# Patient Record
Sex: Male | Born: 1955 | Race: Black or African American | Hispanic: No | Marital: Single | Smoking: Smoker, current status unknown
Health system: Southern US, Community
[De-identification: ages and names within clinical notes are randomized; demographics above are authoritative.]

## PROBLEM LIST (undated history)

## (undated) ENCOUNTER — Encounter

## (undated) ENCOUNTER — Telehealth

## (undated) DIAGNOSIS — F1910 Other psychoactive substance abuse, uncomplicated: Secondary | ICD-10-CM

## (undated) DIAGNOSIS — G709 Myoneural disorder, unspecified: Secondary | ICD-10-CM

## (undated) DIAGNOSIS — E119 Type 2 diabetes mellitus without complications: Secondary | ICD-10-CM

## (undated) DIAGNOSIS — I10 Essential (primary) hypertension: Secondary | ICD-10-CM

## (undated) HISTORY — PX: NO PAST SURGERIES: SHX2092

---

## 2016-10-18 ENCOUNTER — Inpatient Hospital Stay (HOSPITAL_COMMUNITY)
Admission: EM | Admit: 2016-10-18 | Discharge: 2016-10-27 | DRG: 092 | Disposition: A | Discharge status: Skilled Nursing Facility | Payer: Medicare Other | Attending: Internal Medicine | Admitting: Internal Medicine

## 2016-10-18 ENCOUNTER — Emergency Department (HOSPITAL_COMMUNITY): Payer: Medicare Other

## 2016-10-18 ENCOUNTER — Encounter (HOSPITAL_COMMUNITY): Payer: Self-pay | Admitting: Internal Medicine

## 2016-10-18 DIAGNOSIS — T796XXA Traumatic ischemia of muscle, initial encounter: Secondary | ICD-10-CM

## 2016-10-18 DIAGNOSIS — E878 Other disorders of electrolyte and fluid balance, not elsewhere classified: Secondary | ICD-10-CM | POA: Diagnosis present

## 2016-10-18 DIAGNOSIS — E1165 Type 2 diabetes mellitus with hyperglycemia: Secondary | ICD-10-CM | POA: Diagnosis present

## 2016-10-18 DIAGNOSIS — W19XXXA Unspecified fall, initial encounter: Secondary | ICD-10-CM | POA: Diagnosis present

## 2016-10-18 DIAGNOSIS — S0282XA Fracture of other specified skull and facial bones, left side, initial encounter for closed fracture: Secondary | ICD-10-CM | POA: Diagnosis present

## 2016-10-18 DIAGNOSIS — E876 Hypokalemia: Secondary | ICD-10-CM | POA: Diagnosis present

## 2016-10-18 DIAGNOSIS — F1910 Other psychoactive substance abuse, uncomplicated: Secondary | ICD-10-CM

## 2016-10-18 DIAGNOSIS — Z79899 Other long term (current) drug therapy: Secondary | ICD-10-CM

## 2016-10-18 DIAGNOSIS — S060X9A Concussion with loss of consciousness of unspecified duration, initial encounter: Secondary | ICD-10-CM | POA: Diagnosis present

## 2016-10-18 DIAGNOSIS — Z794 Long term (current) use of insulin: Secondary | ICD-10-CM

## 2016-10-18 DIAGNOSIS — S0240DA Maxillary fracture, left side, initial encounter for closed fracture: Secondary | ICD-10-CM | POA: Diagnosis present

## 2016-10-18 DIAGNOSIS — E860 Dehydration: Secondary | ICD-10-CM

## 2016-10-18 DIAGNOSIS — G92 Toxic encephalopathy: Secondary | ICD-10-CM | POA: Diagnosis not present

## 2016-10-18 DIAGNOSIS — E872 Acidosis: Secondary | ICD-10-CM | POA: Diagnosis present

## 2016-10-18 DIAGNOSIS — R739 Hyperglycemia, unspecified: Secondary | ICD-10-CM

## 2016-10-18 DIAGNOSIS — R768 Other specified abnormal immunological findings in serum: Secondary | ICD-10-CM

## 2016-10-18 DIAGNOSIS — R4182 Altered mental status, unspecified: Secondary | ICD-10-CM

## 2016-10-18 DIAGNOSIS — M6282 Rhabdomyolysis: Secondary | ICD-10-CM | POA: Diagnosis present

## 2016-10-18 DIAGNOSIS — I10 Essential (primary) hypertension: Secondary | ICD-10-CM

## 2016-10-18 DIAGNOSIS — S0240FA Zygomatic fracture, left side, initial encounter for closed fracture: Secondary | ICD-10-CM | POA: Diagnosis present

## 2016-10-18 DIAGNOSIS — G9340 Encephalopathy, unspecified: Secondary | ICD-10-CM | POA: Diagnosis present

## 2016-10-18 DIAGNOSIS — R7689 Other specified abnormal immunological findings in serum: Secondary | ICD-10-CM

## 2016-10-18 DIAGNOSIS — S0292XA Unspecified fracture of facial bones, initial encounter for closed fracture: Secondary | ICD-10-CM

## 2016-10-18 DIAGNOSIS — F1410 Cocaine abuse, uncomplicated: Secondary | ICD-10-CM | POA: Diagnosis present

## 2016-10-18 DIAGNOSIS — A530 Latent syphilis, unspecified as early or late: Secondary | ICD-10-CM

## 2016-10-18 DIAGNOSIS — N179 Acute kidney failure, unspecified: Secondary | ICD-10-CM | POA: Diagnosis present

## 2016-10-18 DIAGNOSIS — E119 Type 2 diabetes mellitus without complications: Secondary | ICD-10-CM

## 2016-10-18 DIAGNOSIS — E871 Hypo-osmolality and hyponatremia: Secondary | ICD-10-CM | POA: Diagnosis present

## 2016-10-18 HISTORY — DX: Essential (primary) hypertension: I10

## 2016-10-18 HISTORY — DX: Myoneural disorder, unspecified: G70.9

## 2016-10-18 HISTORY — DX: Other psychoactive substance abuse, uncomplicated: F19.10

## 2016-10-18 HISTORY — DX: Type 2 diabetes mellitus without complications: E11.9

## 2016-10-18 LAB — CBC WITH DIFFERENTIAL/PLATELET
BASOS PCT: 0 %
Basophils Absolute: 0 10*3/uL (ref 0.0–0.1)
Eosinophils Absolute: 0 10*3/uL (ref 0.0–0.7)
Eosinophils Relative: 0 %
HEMATOCRIT: 41.5 % (ref 39.0–52.0)
Hemoglobin: 15 g/dL (ref 13.0–17.0)
Lymphocytes Relative: 9 %
Lymphs Abs: 1.2 10*3/uL (ref 0.7–4.0)
MCH: 31.4 pg (ref 26.0–34.0)
MCHC: 36.1 g/dL — AB (ref 30.0–36.0)
MCV: 86.8 fL (ref 78.0–100.0)
MONOS PCT: 4 %
Monocytes Absolute: 0.5 10*3/uL (ref 0.1–1.0)
NEUTROS ABS: 11.3 10*3/uL — AB (ref 1.7–7.7)
Neutrophils Relative %: 87 %
Platelets: 250 10*3/uL (ref 150–400)
RBC: 4.78 MIL/uL (ref 4.22–5.81)
RDW: 12.2 % (ref 11.5–15.5)
WBC: 13 10*3/uL — ABNORMAL HIGH (ref 4.0–10.5)

## 2016-10-18 LAB — I-STAT CG4 LACTIC ACID, ED
LACTIC ACID, VENOUS: 2.89 mmol/L — AB (ref 0.5–1.9)
Lactic Acid, Venous: 4.46 mmol/L (ref 0.5–1.9)

## 2016-10-18 LAB — COMPREHENSIVE METABOLIC PANEL
ALK PHOS: 120 U/L (ref 38–126)
ALT: 55 U/L (ref 17–63)
ANION GAP: 16 — AB (ref 5–15)
AST: 51 U/L — ABNORMAL HIGH (ref 15–41)
Albumin: 4.3 g/dL (ref 3.5–5.0)
BUN: 24 mg/dL — ABNORMAL HIGH (ref 6–20)
CALCIUM: 9.6 mg/dL (ref 8.9–10.3)
CO2: 28 mmol/L (ref 22–32)
Chloride: 86 mmol/L — ABNORMAL LOW (ref 101–111)
Creatinine, Ser: 1.35 mg/dL — ABNORMAL HIGH (ref 0.61–1.24)
GFR, EST NON AFRICAN AMERICAN: 56 mL/min — AB (ref >60)
GLUCOSE: 539 mg/dL — AB (ref 65–99)
Potassium: 3.5 mmol/L (ref 3.5–5.1)
Sodium: 130 mmol/L — ABNORMAL LOW (ref 135–145)
Total Bilirubin: 1.7 mg/dL — ABNORMAL HIGH (ref 0.3–1.2)
Total Protein: 7.6 g/dL (ref 6.5–8.1)

## 2016-10-18 LAB — RAPID URINE DRUG SCREEN, HOSP PERFORMED
Amphetamines: NOT DETECTED
Barbiturates: NOT DETECTED
Benzodiazepines: NOT DETECTED
Cocaine: POSITIVE — AB
Opiates: NOT DETECTED
TETRAHYDROCANNABINOL: NOT DETECTED

## 2016-10-18 LAB — URINALYSIS, ROUTINE W REFLEX MICROSCOPIC
BILIRUBIN URINE: NEGATIVE
Bacteria, UA: NONE SEEN
Glucose, UA: >=500 mg/dL — AB
Ketones, ur: NEGATIVE mg/dL
LEUKOCYTES UA: NEGATIVE
NITRITE: NEGATIVE
Protein, ur: NEGATIVE mg/dL
SPECIFIC GRAVITY, URINE: 1.027 (ref 1.005–1.030)
pH: 5 (ref 5.0–8.0)

## 2016-10-18 LAB — CBG MONITORING, ED
GLUCOSE-CAPILLARY: 528 mg/dL — AB (ref 65–99)
GLUCOSE-CAPILLARY: 395 mg/dL — AB (ref 65–99)
Glucose-Capillary: 294 mg/dL — ABNORMAL HIGH (ref 65–99)

## 2016-10-18 LAB — BASIC METABOLIC PANEL
Anion gap: 12 (ref 5–15)
BUN: 22 mg/dL — AB (ref 6–20)
CHLORIDE: 94 mmol/L — AB (ref 101–111)
CO2: 27 mmol/L (ref 22–32)
CREATININE: 1.15 mg/dL (ref 0.61–1.24)
Calcium: 8.9 mg/dL (ref 8.9–10.3)
GFR calc Af Amer: >60 mL/min (ref >60)
GFR calc non Af Amer: >60 mL/min (ref >60)
GLUCOSE: 448 mg/dL — AB (ref 65–99)
Potassium: 3.4 mmol/L — ABNORMAL LOW (ref 3.5–5.1)
SODIUM: 133 mmol/L — AB (ref 135–145)

## 2016-10-18 LAB — I-STAT VENOUS BLOOD GAS, ED
ACID-BASE EXCESS: 7 mmol/L — AB (ref 0.0–2.0)
BICARBONATE: 33.9 mmol/L — AB (ref 20.0–28.0)
O2 SAT: 62 %
PO2 VEN: 32 mmHg (ref 32.0–45.0)
TCO2: 36 mmol/L (ref 0–100)
pCO2, Ven: 52.9 mmHg (ref 44.0–60.0)
pH, Ven: 7.415 (ref 7.250–7.430)

## 2016-10-18 LAB — CK: CK TOTAL: 1000 U/L — AB (ref 49–397)

## 2016-10-18 LAB — ETHANOL

## 2016-10-18 LAB — MRSA PCR SCREENING: MRSA by PCR: NEGATIVE

## 2016-10-18 LAB — I-STAT TROPONIN, ED: Troponin i, poc: 0.01 ng/mL (ref 0.00–0.08)

## 2016-10-18 LAB — LIPASE, BLOOD: Lipase: 18 U/L (ref 11–51)

## 2016-10-18 LAB — MAGNESIUM
MAGNESIUM: 2.3 mg/dL (ref 1.7–2.4)
MAGNESIUM: 2.2 mg/dL (ref 1.7–2.4)

## 2016-10-18 LAB — GLUCOSE, CAPILLARY: Glucose-Capillary: 274 mg/dL — ABNORMAL HIGH (ref 65–99)

## 2016-10-18 LAB — SALICYLATE LEVEL: Salicylate Lvl: <7 mg/dL (ref 2.8–30.0)

## 2016-10-18 LAB — ACETAMINOPHEN LEVEL

## 2016-10-18 LAB — RAPID HIV SCREEN (HIV 1/2 AB+AG)
HIV 1/2 ANTIBODIES: NONREACTIVE
HIV-1 P24 Antigen - HIV24: NONREACTIVE

## 2016-10-18 LAB — LACTIC ACID, PLASMA
Lactic Acid, Venous: 2.3 mmol/L (ref 0.5–1.9)
Lactic Acid, Venous: 1.7 mmol/L (ref 0.5–1.9)

## 2016-10-18 LAB — TROPONIN I: Troponin I: 0.03 ng/mL (ref <0.03)

## 2016-10-18 MED ORDER — BACITRACIN ZINC 500 UNIT/GM EX OINT
1.0000 "application " | TOPICAL_OINTMENT | Freq: Two times a day (BID) | CUTANEOUS | Status: DC
  Administered 2016-10-18 – 2016-10-23 (×12): 1 via TOPICAL
  Filled 2016-10-18 (×4): qty 28.35

## 2016-10-18 MED ORDER — POTASSIUM CHLORIDE CRYS ER 20 MEQ PO TBCR: 60.0000 meq | EXTENDED_RELEASE_TABLET | Freq: Once | ORAL | Status: DC

## 2016-10-18 MED ORDER — VANCOMYCIN HCL IN DEXTROSE 1-5 GM/200ML-% IV SOLN
1000.0000 mg | Freq: Once | INTRAVENOUS | Status: AC
  Administered 2016-10-18: 1000 mg via INTRAVENOUS
  Filled 2016-10-18: qty 200

## 2016-10-18 MED ORDER — POTASSIUM CHLORIDE CRYS ER 20 MEQ PO TBCR: 40.0000 meq | EXTENDED_RELEASE_TABLET | Freq: Once | ORAL | Status: DC

## 2016-10-18 MED ORDER — PIPERACILLIN-TAZOBACTAM 3.375 G IVPB: 3.3750 g | Freq: Three times a day (TID) | INTRAVENOUS | Status: DC

## 2016-10-18 MED ORDER — SODIUM CHLORIDE 0.9 % IV BOLUS (SEPSIS)
1000.0000 mL | Freq: Once | INTRAVENOUS | Status: AC
  Administered 2016-10-18: 1000 mL via INTRAVENOUS

## 2016-10-18 MED ORDER — SODIUM CHLORIDE 0.9 % IV SOLN
INTRAVENOUS | Status: DC
  Filled 2016-10-18: qty 1

## 2016-10-18 MED ORDER — NALOXONE HCL 0.4 MG/ML IJ SOLN
0.4000 mg | Freq: Once | INTRAMUSCULAR | Status: AC
  Administered 2016-10-18: 0.4 mg via INTRAVENOUS
  Filled 2016-10-18: qty 1

## 2016-10-18 MED ORDER — INSULIN ASPART 100 UNIT/ML ~~LOC~~ SOLN
0.0000 [IU] | Freq: Three times a day (TID) | SUBCUTANEOUS | Status: DC
  Administered 2016-10-19: 9 [IU] via SUBCUTANEOUS
  Administered 2016-10-19: 2 [IU] via SUBCUTANEOUS
  Administered 2016-10-20: 1 [IU] via SUBCUTANEOUS
  Administered 2016-10-21 – 2016-10-22 (×3): 2 [IU] via SUBCUTANEOUS
  Administered 2016-10-22: 1 [IU] via SUBCUTANEOUS
  Administered 2016-10-23 (×2): 5 [IU] via SUBCUTANEOUS
  Administered 2016-10-23: 3 [IU] via SUBCUTANEOUS
  Administered 2016-10-24: 7 [IU] via SUBCUTANEOUS
  Administered 2016-10-24: 9 [IU] via SUBCUTANEOUS

## 2016-10-18 MED ORDER — FOLIC ACID 1 MG PO TABS: 1.0000 mg | ORAL_TABLET | Freq: Every day | ORAL | Status: DC

## 2016-10-18 MED ORDER — ENOXAPARIN SODIUM 40 MG/0.4ML ~~LOC~~ SOLN
40.0000 mg | SUBCUTANEOUS | Status: DC
  Administered 2016-10-19 – 2016-10-22 (×4): 40 mg via SUBCUTANEOUS
  Filled 2016-10-18 (×4): qty 0.4

## 2016-10-18 MED ORDER — VANCOMYCIN HCL IN DEXTROSE 750-5 MG/150ML-% IV SOLN: 750.0000 mg | INTRAVENOUS | Status: DC

## 2016-10-18 MED ORDER — ADULT MULTIVITAMIN W/MINERALS CH
1.0000 | ORAL_TABLET | Freq: Every day | ORAL | Status: DC
  Administered 2016-10-21 – 2016-10-27 (×6): 1 via ORAL
  Filled 2016-10-18 (×7): qty 1

## 2016-10-18 MED ORDER — SODIUM CHLORIDE 0.9 % IV SOLN
INTRAVENOUS | Status: DC
  Administered 2016-10-18 – 2016-10-23 (×14): via INTRAVENOUS

## 2016-10-18 MED ORDER — POLYETHYLENE GLYCOL 3350 17 G PO PACK: 17.0000 g | PACK | Freq: Every day | ORAL | Status: DC | PRN

## 2016-10-18 MED ORDER — SALINE SPRAY 0.65 % NA SOLN
1.0000 | NASAL | Status: DC | PRN
  Administered 2016-10-23 – 2016-10-26 (×2): 1 via NASAL
  Filled 2016-10-18 (×2): qty 44

## 2016-10-18 MED ORDER — PIPERACILLIN-TAZOBACTAM 3.375 G IVPB 30 MIN
3.3750 g | Freq: Once | INTRAVENOUS | Status: AC
  Administered 2016-10-18: 3.375 g via INTRAVENOUS
  Filled 2016-10-18: qty 50

## 2016-10-18 MED ORDER — ACETAMINOPHEN 325 MG PO TABS
650.0000 mg | ORAL_TABLET | Freq: Four times a day (QID) | ORAL | Status: DC | PRN
  Administered 2016-10-21 – 2016-10-26 (×6): 650 mg via ORAL
  Filled 2016-10-18 (×7): qty 2

## 2016-10-18 MED ORDER — LORAZEPAM 2 MG/ML IJ SOLN
2.0000 mg | INTRAMUSCULAR | Status: DC | PRN
  Administered 2016-10-18: 2 mg via INTRAVENOUS
  Administered 2016-10-18: 3 mg via INTRAVENOUS
  Filled 2016-10-18: qty 2
  Filled 2016-10-18: qty 1

## 2016-10-18 MED ORDER — VITAMIN B-1 100 MG PO TABS: 100.0000 mg | ORAL_TABLET | Freq: Every day | ORAL | Status: DC

## 2016-10-18 MED ORDER — INSULIN ASPART 100 UNIT/ML ~~LOC~~ SOLN
0.0000 [IU] | Freq: Every day | SUBCUTANEOUS | Status: DC
  Administered 2016-10-18: 3 [IU] via SUBCUTANEOUS
  Administered 2016-10-22: 2 [IU] via SUBCUTANEOUS

## 2016-10-18 MED ORDER — INSULIN ASPART 100 UNIT/ML ~~LOC~~ SOLN
4.0000 [IU] | Freq: Once | SUBCUTANEOUS | Status: AC
  Administered 2016-10-18: 4 [IU] via INTRAVENOUS
  Filled 2016-10-18: qty 1

## 2016-10-18 MED ORDER — DEXTROSE-NACL 5-0.45 % IV SOLN: INTRAVENOUS | Status: DC

## 2016-10-18 MED ORDER — ACETAMINOPHEN 650 MG RE SUPP: 650.0000 mg | Freq: Four times a day (QID) | RECTAL | Status: DC | PRN

## 2016-10-18 NOTE — ED Notes (Addendum)
Date and time results received: 10/18/16 0820   Test: glucose  Critical Value: 539  Name of Provider Notified: Clarene DukeLittle, MD

## 2016-10-18 NOTE — ED Notes (Signed)
Pt. Awoke after narcan administration. Pt. AO to person place, but disoriented to time and situation. Pt. Able to urinate in urinal for specimen. EDP made aware. Pt. Sleeping comfortably at this time.

## 2016-10-18 NOTE — ED Notes (Signed)
Patient wakening stating he needs to pee, attempting to get out of the bed and remove ekg wires and stickers. Patient assisted with use of the urinal, trying to calm patient.

## 2016-10-18 NOTE — ED Provider Notes (Signed)
[facility]-EMERGENCY DEPT Provider Note   CSN: [id number] Arrival date & time: 10/18/16  0708     History   Chief Complaint Chief Complaint  Patient presents with  . Altered Mental Status    HPI Sean Walsh is a 61 y.o. male.  61 year old male with unknown past medical history who presents with altered mental status and alcohol intoxication. Bystanders found the patient lying on the street moaning. EMS was called and they stated that the patient was altered and admitted to alcohol intoxication. They suspect he may have fallen due to abrasions on his head. CBG 434 by EMS.  LEVEL 5 CAVEAT DUE TO AMS   The history is provided by the EMS personnel.  Alcohol Intoxication     No past medical history on file.  There are no active problems to display for this patient.   No past surgical history on file.     Home Medications    Prior to Admission medications   Not on File    Family History No family history on file.  Social History Social History  Substance Use Topics  . Smoking status: Not on file  . Smokeless tobacco: Not on file  . Alcohol use Not on file     Allergies   Patient has no allergy information on record.   Review of Systems Review of Systems  Unable to perform ROS: Mental status change     Physical Exam Updated Vital Signs BP (!) 158/84   Pulse 73   Temp (!) 96.8 F (36 C) (Rectal)   Resp (!) 9   Ht 5\' 5"  (1.651 m)   Wt 45.4 kg (100 lb)   SpO2 99%   BMI 16.64 kg/m   Physical Exam  Constitutional:  Emaciated, chronically ill appearing, altered  HENT:  Head: Normocephalic.  Multiple abrasions on face, dried blood L naris  Eyes: Conjunctivae are normal.  L pupil 3mm round, R pupil 2mm round; both pupils sluggishly reactive to light  Neck:  In soft collar  Cardiovascular: Normal rate, regular rhythm and normal heart sounds.   No murmur heard. Pulmonary/Chest: Effort normal and breath sounds normal.  Abdominal: Soft. Bowel sounds  are normal. He exhibits no distension. There is no tenderness.  Musculoskeletal: He exhibits no edema or deformity.  Neurological:  Altered, non-verbal, responds to painful stimuli  Skin: Skin is dry.  cool  Nursing note and vitals reviewed.    ED Treatments / Results  Labs (all labs ordered are listed, but only abnormal results are displayed) Labs Reviewed  COMPREHENSIVE METABOLIC PANEL - Abnormal; Notable for the following:       Result Value   Sodium 130 (*)    Chloride 86 (*)    Glucose, Bld 539 (*)    BUN 24 (*)    Creatinine, Ser 1.35 (*)    AST 51 (*)    Total Bilirubin 1.7 (*)    GFR calc non Af Amer 56 (*)    Anion gap 16 (*)    All other components within normal limits  ACETAMINOPHEN LEVEL - Abnormal; Notable for the following:    Acetaminophen (Tylenol), Serum <10 (*)    All other components within normal limits  CBC WITH DIFFERENTIAL/PLATELET - Abnormal; Notable for the following:    WBC 13.0 (*)    MCHC 36.1 (*)    Neutro Abs 11.3 (*)    All other components within normal limits  RAPID URINE DRUG SCREEN, HOSP PERFORMED - Abnormal; Notable for the  following:    Cocaine POSITIVE (*)    All other components within normal limits  URINALYSIS, ROUTINE W REFLEX MICROSCOPIC - Abnormal; Notable for the following:    Glucose, UA >=500 (*)    Hgb urine dipstick SMALL (*)    Squamous Epithelial / LPF 0-5 (*)    All other components within normal limits  CK - Abnormal; Notable for the following:    Total CK 1,000 (*)    All other components within normal limits  BASIC METABOLIC PANEL - Abnormal; Notable for the following:    Sodium 133 (*)    Potassium 3.4 (*)    Chloride 94 (*)    Glucose, Bld 448 (*)    BUN 22 (*)    All other components within normal limits  CBG MONITORING, ED - Abnormal; Notable for the following:    Glucose-Capillary 528 (*)    All other components within normal limits  I-STAT CG4 LACTIC ACID, ED - Abnormal; Notable for the following:     Lactic Acid, Venous 4.46 (*)    All other components within normal limits  I-STAT VENOUS BLOOD GAS, ED - Abnormal; Notable for the following:    Bicarbonate 33.9 (*)    Acid-Base Excess 7.0 (*)    All other components within normal limits  I-STAT CG4 LACTIC ACID, ED - Abnormal; Notable for the following:    Lactic Acid, Venous 2.89 (*)    All other components within normal limits  CBG MONITORING, ED - Abnormal; Notable for the following:    Glucose-Capillary 395 (*)    All other components within normal limits  CBG MONITORING, ED - Abnormal; Notable for the following:    Glucose-Capillary 294 (*)    All other components within normal limits  CULTURE, BLOOD (ROUTINE X 2)  CULTURE, BLOOD (ROUTINE X 2)  URINE CULTURE  LIPASE, BLOOD  ETHANOL  SALICYLATE LEVEL  RAPID HIV SCREEN (HIV 1/2 AB+AG)  I-STAT TROPOININ, ED  I-STAT CG4 LACTIC ACID, ED    EKG  EKG Interpretation  Date/Time:  Wednesday October 18 2016 07:35:44 EDT Ventricular Rate:  79 PR Interval:    QRS Duration: 103 QT Interval:  419 QTC Calculation: 481 R Axis:   70 Text Interpretation:  Sinus rhythm Anteroseptal infarct, age indeterminate ST depression in inferior leads with T wave inversions laterally, no previous tracing available Confirmed by Frederick Peers [phone]) on 10/18/2016 7:52:53 AM Also confirmed by Frederick Peers [phone]), editor Misty Stanley [phone])  on 10/18/2016 10:02:56 AM       Radiology Ct Head Wo Contrast  Result Date: 10/18/2016 CLINICAL DATA:  Altered mental status, found unresponsive EXAM: CT HEAD WITHOUT CONTRAST CT CERVICAL SPINE WITHOUT CONTRAST TECHNIQUE: Multidetector CT imaging of the head and cervical spine was performed following the standard protocol without intravenous contrast. Multiplanar CT image reconstructions of the cervical spine were also generated. COMPARISON:  None. FINDINGS: CT HEAD FINDINGS Brain: Mild atrophic changes are noted. No findings to suggest acute hemorrhage or  acute infarction are seen. No space-occupying mass lesion is noted. Vascular: No hyperdense vessel or unexpected calcification. Skull: The calvarium is intact. Sinuses/Orbits: There are fractures involving the left maxillary antrum in the anterior, superior and lateral walls as well as in the multifocal mildly displaced fracture of the left zygomatic arch and fracture of the left orbital wall. Only minimal soft tissue changes are seen. A small air-fluid level is noted in the left maxillary antrum. Other: None CT CERVICAL SPINE FINDINGS Alignment: Within normal  limits. Skull base and vertebrae: 7 cervical segments are well visualized. Multilevel disc space narrowing is noted worst at the C5-6 level. Anterior and posterior osteophytes are noted. Mild facet hypertrophic changes are seen. No acute fracture or acute facet abnormality is seen. Soft tissues and spinal canal: No specific soft tissue abnormality is noted. Scattered vascular calcifications are seen. Upper chest: Within normal limits. IMPRESSION: CT of the head: No acute intracranial abnormality is noted. There are however multiple fractures involving the left maxillary antrum, left zygomatic arch and lateral wall of the left orbit as described. CT of the cervical spine: Multilevel degenerative change without acute bony abnormality. Electronically Signed   By: Alcide Clever M.D.   On: 10/18/2016 10:37   Ct Cervical Spine Wo Contrast  Result Date: 10/18/2016 CLINICAL DATA:  Altered mental status, found unresponsive EXAM: CT HEAD WITHOUT CONTRAST CT CERVICAL SPINE WITHOUT CONTRAST TECHNIQUE: Multidetector CT imaging of the head and cervical spine was performed following the standard protocol without intravenous contrast. Multiplanar CT image reconstructions of the cervical spine were also generated. COMPARISON:  None. FINDINGS: CT HEAD FINDINGS Brain: Mild atrophic changes are noted. No findings to suggest acute hemorrhage or acute infarction are seen. No  space-occupying mass lesion is noted. Vascular: No hyperdense vessel or unexpected calcification. Skull: The calvarium is intact. Sinuses/Orbits: There are fractures involving the left maxillary antrum in the anterior, superior and lateral walls as well as in the multifocal mildly displaced fracture of the left zygomatic arch and fracture of the left orbital wall. Only minimal soft tissue changes are seen. A small air-fluid level is noted in the left maxillary antrum. Other: None CT CERVICAL SPINE FINDINGS Alignment: Within normal limits. Skull base and vertebrae: 7 cervical segments are well visualized. Multilevel disc space narrowing is noted worst at the C5-6 level. Anterior and posterior osteophytes are noted. Mild facet hypertrophic changes are seen. No acute fracture or acute facet abnormality is seen. Soft tissues and spinal canal: No specific soft tissue abnormality is noted. Scattered vascular calcifications are seen. Upper chest: Within normal limits. IMPRESSION: CT of the head: No acute intracranial abnormality is noted. There are however multiple fractures involving the left maxillary antrum, left zygomatic arch and lateral wall of the left orbit as described. CT of the cervical spine: Multilevel degenerative change without acute bony abnormality. Electronically Signed   By: Alcide Clever M.D.   On: 10/18/2016 10:37   Dg Chest Port 1 View  Result Date: 10/18/2016 CLINICAL DATA:  Altered mental status.  Patient found down. EXAM: PORTABLE CHEST 1 VIEW COMPARISON:  None. FINDINGS: The heart size and mediastinal contours are within normal limits. Both lungs are clear. The visualized skeletal structures are unremarkable. Aortic atherosclerosis. IMPRESSION: No active disease. Aortic atherosclerosis. Electronically Signed   By: Francene Boyers M.D.   On: 10/18/2016 09:00    Procedures .Critical Care Performed by: Laurence Spates Authorized by: Laurence Spates   Critical care provider  statement:    Critical care time (minutes):  45   Critical care time was exclusive of:  Separately billable procedures and treating other patients   Critical care was necessary to treat or prevent imminent or life-threatening deterioration of the following conditions:  Endocrine crisis and dehydration   Critical care was time spent personally by me on the following activities:  Evaluation of patient's response to treatment, examination of patient, obtaining history from patient or surrogate, ordering and performing treatments and interventions, ordering and review of laboratory studies, ordering  and review of radiographic studies and re-evaluation of patient's condition   (including critical care time)  Medications Ordered in ED Medications  piperacillin-tazobactam (ZOSYN) IVPB 3.375 g (not administered)  vancomycin (VANCOCIN) IVPB 750 mg/150 ml premix (not administered)  insulin regular (NOVOLIN R,HUMULIN R) 100 Units in sodium chloride 0.9 % 100 mL (1 Units/mL) infusion (0 Units/hr Intravenous Hold 10/18/16 1001)  0.9 %  sodium chloride infusion ( Intravenous New Bag/Given 10/18/16 1105)  dextrose 5 %-0.45 % sodium chloride infusion ( Intravenous Not Given 10/18/16 1041)  bacitracin ointment 1 application (1 application Topical Given 10/18/16 1004)  sodium chloride 0.9 % bolus 1,000 mL (0 mLs Intravenous Stopped 10/18/16 0859)  naloxone (NARCAN) injection 0.4 mg (0.4 mg Intravenous Given 10/18/16 0742)  piperacillin-tazobactam (ZOSYN) IVPB 3.375 g (0 g Intravenous Stopped 10/18/16 0830)  vancomycin (VANCOCIN) IVPB 1000 mg/200 mL premix (0 mg Intravenous Stopped 10/18/16 0855)  sodium chloride 0.9 % bolus 1,000 mL (0 mLs Intravenous Stopped 10/18/16 1042)  insulin aspart (novoLOG) injection 4 Units (4 Units Intravenous Given 10/18/16 0905)  sodium chloride 0.9 % bolus 1,000 mL (0 mLs Intravenous Stopped 10/18/16 1105)     Initial Impression / Assessment and Plan / ED Course  I have reviewed the triage vital  signs and the nursing notes.  Pertinent labs & imaging results that were available during my care of the patient were reviewed by me and considered in my medical decision making (see chart for details).    PT w/ unknown medical hx brought in by EMS after he was found altered, moaning outside. On arrival, he was somnolent, responsive to painful stimuli, VS notable for temp 94.8 rectally. He had abrasions on his face. Initiated a code sepsis based on hypothermia and altered mentation. Blood and urine cultures sent, gave vancomycin and Zosyn. Gave Narcan and patient immediately became more alert, I suspect possible drug ingestion.   Gave 2 L of IV fluids. Initial lactate elevated at 4.46, venous pH 7.42, sodium 130, chloride 86, potassium 3.5, glucose 539, creatinine 1.35, anion gap 16, AST 51, total bilirubin 1.7. CBC 13,000. CK elevated at 1000. Because of the CK and hypothermia, I suspect he may have passed out and laid still outside for prolonged period of time. Gave IV insulin bolus.  Repeat BMP shows that his anion gap has closed therefore do not feel he needs an insulin drip at this time. UDS + for cocaine. Head CT negative for intracranial injury but does show several facial fractures. He has no evidence of entrapment on CT and I feel he can follow-up with ENT for these injuries with no nose blowing/elevation of head of bed/ice to face for supportive treatment. Discussed admission with internal medicine teaching service and patient admitted for further care.  Final Clinical Impressions(s) / ED Diagnoses   Final diagnoses:  Altered mental status, unspecified altered mental status type  Dehydration  Traumatic rhabdomyolysis, initial encounter (HCC)  Closed extensive facial fractures, initial encounter [facility])  Substance abuse  Hyperglycemia    New Prescriptions New Prescriptions   No medications on file     Annalea Alguire, Ambrose Finland, MD 10/18/16 1227

## 2016-10-18 NOTE — ED Notes (Addendum)
Contacted main lab about missing urine results. Spoke with Selena BattenKim. They state the do not have sample. Will send new sample.

## 2016-10-18 NOTE — Progress Notes (Signed)
Pharmacy Antibiotic Note  Sean Walsh is a 61 y.o. male admitted on 10/18/2016 with sepsis.  Pharmacy has been consulted for vancomycin and zosyn dosing. Pt is hypothermic and WBC is elevated at 13. SCr is mildly elevated at 1.35 and lactic acid is >4. Pt unable to provide weight so RN estimated 100lb.   Plan: Vancomycin 1gm IV x 1 then 750mg  IV Q24J Zosyn 3.375gm IV Q8H (4 hr inf) F/u renal fxn, C&S, clinical status and trough at SS F/u measured weight and adjust doses if needed  Height: 5\' 5"  (165.1 cm) Weight: 100 lb (45.4 kg) IBW/kg (Calculated) : 61.5  Temp (24hrs), Avg:96.3 F (35.7 C), Min:94.8 F (34.9 C), Max:97.7 F (36.5 C)  No results for input(s): WBC, CREATININE, LATICACIDVEN, VANCOTROUGH, VANCOPEAK, VANCORANDOM, GENTTROUGH, GENTPEAK, GENTRANDOM, TOBRATROUGH, TOBRAPEAK, TOBRARND, AMIKACINPEAK, AMIKACINTROU, AMIKACIN in the last 168 hours.  CrCl cannot be calculated (No order found.).    Allergies not on file  Antimicrobials this admission: Vanc 6/6>> Zosyn 6/6>>  Dose adjustments this admission: N/A  Microbiology results: Pending  Thank you for allowing pharmacy to be a part of this patient's care.  Harless Molinari, Drake LeachRachel Lynn 10/18/2016 7:33 AM

## 2016-10-18 NOTE — ED Notes (Signed)
Phlebotomy at bedside drawing cultures. Will start abx after.

## 2016-10-18 NOTE — ED Triage Notes (Addendum)
Patient via [facility] EMS was found outside in downtown [place]. Bystanders called ems because they heard him groaning in bushes. Patient presents with head abrasion and unequal pupils that are minimally reactive. Patient admits to ETOH use in route. Patient minimally verbal on arrival to ED. CBG 434 with ems. Patient responds to pain on arrival but not to voice.

## 2016-10-18 NOTE — ED Notes (Signed)
Admitting MD advising step down instead of med surg. Will wait for orders.

## 2016-10-18 NOTE — ED Notes (Signed)
EDP advised to wait until 2nd liter fluid finished-recheck CBG then can start insulin drip.

## 2016-10-18 NOTE — ED Notes (Signed)
Admitting at bedside 

## 2016-10-18 NOTE — ED Notes (Signed)
EDP advising hold insulin until BMP repeat.

## 2016-10-18 NOTE — H&P (Signed)
Date: 10/18/2016               Patient Name:  Sean Walsh MRN: [id number]  DOB: 30-Jun-1955 Age / Sex: 61 y.o., male   PCP: Patient, No Pcp Per         Medical Service: Internal Medicine Teaching Service         Attending Physician: Dr. Lucious Groves, DO    First Contact: Dr. Hetty Ely  Pager: [phone]  Second Contact: Dr. Juleen China  Pager: [phone]       After Hours (After 5p/  First Contact Pager: [phone]  weekends / holidays): Second Contact Pager: [phone]   Chief Complaint: presents after a fall   History of Present Illness: 61 yo man with PMH type II diabetes and hepatomegaly who was brought into the ED via EMS after he was found moaning in the street in downtown [place]. He is a [facility] patient, a limited medical history was obtained from his clinical social worker Lanny Cramp over the phone [phone] ext [phone], [phone]) When he arrived in the ED he was found to have multiple abrasions over his face, shoulders, and knees with unequal pupils that were minimally reactive to light. He admitted to ETOH use and was minimally verbal upon arrival to the ED. CBG was 434 with EMS. He was found to have a rectal temp 94.8, HR 78, BP 159/91, SpO2 99% on room air. Code Sepsis was called for hypothermia and AMS and he was given a dose of vancomycin, zosyn, and 2 L IV fluids. He was given narcan and immediately became more alert. Initial lactate was elevated 4.46, venous pH 7.42 (correlates with pH 7.46), pCO2 53 (correlates with arterial pCO2 48), bicarb 28, anion gap 16, glucose 539, CK 1000, Na 130, BUN 24, Crt 1.35. He was given an IV insulin bolus and blood cultures were drawn. Repeat BMP showed anion gap had closed, UDS was positive for cocaine. Head CT was negative for intracranial injury but showed several facial fractures, he had no evidence of entrapment.  He does report that he drank alcohol days ago but is not able to go into details. He is not able to provide the details  of what happened prior to his being found. He does say that he has chest pain, it is not reporoducible but he is not able to explain further.   Meds:  No outpatient prescriptions have been marked as taking for the 10/18/16 encounter [facility] Encounter).     Allergies: Allergies as of 10/18/2016  . (No Known Allergies)   Past Medical History:  Diagnosis Date  . Diabetes mellitus without complication ([place])   . Substance abuse     Family History: No family history on file. Could not be obtained as the patient is disoriented.   Social History: Admits to drinking alcohol, denies illicit drug use.   Review of Systems: A complete ROS was negative except as per HPI.   Physical Exam: Blood pressure (!) 159/88, pulse 76, temperature 98.3 F (36.8 C), temperature source Oral, resp. rate 15, height 5' 5"  (1.651 m), weight 100 lb (45.4 kg), SpO2 98 %. Physical Exam  HENT:  Head: Normocephalic.  Eyes: Conjunctivae are normal. Right eye exhibits no discharge. Left eye exhibits no discharge. No scleral icterus.  Cardiovascular: Normal rate and regular rhythm.   Pulmonary/Chest: Effort normal and breath sounds normal. No respiratory distress. He has no wheezes. He has no rales. He exhibits no tenderness.  Abdominal: Soft. He  exhibits no distension and no mass. There is no tenderness. There is no guarding.  Liver is smooth and palpable   Neurological:  Knows his name and date of birth, says the date is "wednesday the 9 nd" cannot tell me the year, says that he is in [place]   Both pupils are small, right is smaller than left, they are minimally reactive to light Moving all extremities   Skin: Skin is warm and dry.  Abrasions over cheeks and forehead, left shoulder, and both knees   Psychiatric:  irratic behavior, he sometimes speaks aggressively but he is not physically combative    Labs:  Na 130, K 3.5, Cl 86, Bicarb 28, BUN 24, crt 1.35, glucose 539 Alk phos 120, AST 51, ALT 55, T  bili 1.7, lipase 18 WBC 13 (11% neutrophils), Hgb 15, Plt 250  VBG 7.42, pCO2 53  CK 1000  Trop 0.01 Lactic acid 4.46  Acetaminophen, salicylate, and alcohol undetectable   EKG: sinus rhythm, ST depressions in leads II, III, and aVF,   CXR: pulmonary hyperinflation, no acute cardiopulmonary disease   Assessment & Plan by Problem: Active Problems:   Rhabdomyolysis   Facial fracture due to fall (HCC)   Hypokalemia   Substance abuse  Rhabdomyolysis  He was found down with elevated CK at presentation. He has pain over his side. Crt is not elevated at this time and he continues to have good urine output. Urine revealed hgb on dipstick but was negative for RBCsWill continue to monitor for compartment syndrome and crush related renal injury.  -ordered normal saline at 150 cc/hr, 2/p 3 L NS bolus  -follow up CK tomorrow morning  -Follow up blood cultures - follow up troponin  -follow up CK in the morning   Hypokalemia  K 3.4 at presentation, repleated with 40 meq.  -Follow up BMP   Hyponatremia  Improving with IV fluids.  - NS at 150 cc/hr  Elevated lactic acid  At presentation he had a lactic acid 4.4 and met SIRS criteria (temp 94.8, WBC 13), lactic acid has since trended down after IV fluids.  - follow up blood cultures   Facial fractures  Ct head revealed multiple facial fractures. ENT was curbside consulted over the phone, they recommended conservative management with saline nasal spray, ice packs, and not blowing his nose. They recommended reassessment for diplopia when he is more awake and possible opthalmology referral at that time. ENT will follow up with him outpatient in one week.  -ordered ocean  - bacitracin ointment   Substance abuse  Reports alcohol use and UDS was positive for cocaine. He became more alert after narcan was administered in the ED. He is more so agitated than obtunded, I do not think he is in opioid intoxication. He reports his last drink of alcohol  was 2 days ago, at this point his vitals do not suggest he is in alcohol withdrawal  - CIWA monitoring with ativan  -thiamine, MVM, folic acid  - Ordered RPR, Hepatitis C, and HIV  -social work consult   Type II Diabetes No prior A1cs or home medications in our epic system.  - ISS sensitive with HS coverage  Dispo: Admit patient to Observation with expected length of stay less than 2 midnights.  Signed: Ledell Noss, MD 10/18/2016, 4:17 PM  Pager: [phone]

## 2016-10-19 DIAGNOSIS — G9340 Encephalopathy, unspecified: Secondary | ICD-10-CM | POA: Diagnosis present

## 2016-10-19 DIAGNOSIS — S0240FA Zygomatic fracture, left side, initial encounter for closed fracture: Secondary | ICD-10-CM | POA: Diagnosis present

## 2016-10-19 DIAGNOSIS — F1910 Other psychoactive substance abuse, uncomplicated: Secondary | ICD-10-CM | POA: Diagnosis not present

## 2016-10-19 DIAGNOSIS — E119 Type 2 diabetes mellitus without complications: Secondary | ICD-10-CM | POA: Diagnosis not present

## 2016-10-19 DIAGNOSIS — F1410 Cocaine abuse, uncomplicated: Secondary | ICD-10-CM | POA: Diagnosis present

## 2016-10-19 DIAGNOSIS — S0292XA Unspecified fracture of facial bones, initial encounter for closed fracture: Secondary | ICD-10-CM | POA: Diagnosis not present

## 2016-10-19 DIAGNOSIS — E512 Wernicke's encephalopathy: Secondary | ICD-10-CM

## 2016-10-19 DIAGNOSIS — N179 Acute kidney failure, unspecified: Secondary | ICD-10-CM | POA: Diagnosis present

## 2016-10-19 DIAGNOSIS — E876 Hypokalemia: Secondary | ICD-10-CM | POA: Diagnosis present

## 2016-10-19 DIAGNOSIS — S0282XA Fracture of other specified skull and facial bones, left side, initial encounter for closed fracture: Secondary | ICD-10-CM | POA: Diagnosis present

## 2016-10-19 DIAGNOSIS — S0240DA Maxillary fracture, left side, initial encounter for closed fracture: Secondary | ICD-10-CM | POA: Diagnosis present

## 2016-10-19 DIAGNOSIS — M6282 Rhabdomyolysis: Secondary | ICD-10-CM | POA: Diagnosis not present

## 2016-10-19 DIAGNOSIS — I10 Essential (primary) hypertension: Secondary | ICD-10-CM | POA: Diagnosis present

## 2016-10-19 DIAGNOSIS — W19XXXA Unspecified fall, initial encounter: Secondary | ICD-10-CM | POA: Diagnosis present

## 2016-10-19 DIAGNOSIS — X58XXXA Exposure to other specified factors, initial encounter: Secondary | ICD-10-CM | POA: Diagnosis not present

## 2016-10-19 DIAGNOSIS — T796XXA Traumatic ischemia of muscle, initial encounter: Secondary | ICD-10-CM | POA: Diagnosis present

## 2016-10-19 DIAGNOSIS — E1165 Type 2 diabetes mellitus with hyperglycemia: Secondary | ICD-10-CM | POA: Diagnosis present

## 2016-10-19 DIAGNOSIS — S060X9A Concussion with loss of consciousness of unspecified duration, initial encounter: Secondary | ICD-10-CM | POA: Diagnosis present

## 2016-10-19 DIAGNOSIS — Z794 Long term (current) use of insulin: Secondary | ICD-10-CM | POA: Diagnosis not present

## 2016-10-19 DIAGNOSIS — E871 Hypo-osmolality and hyponatremia: Secondary | ICD-10-CM | POA: Diagnosis present

## 2016-10-19 DIAGNOSIS — E872 Acidosis: Secondary | ICD-10-CM | POA: Diagnosis present

## 2016-10-19 DIAGNOSIS — G92 Toxic encephalopathy: Secondary | ICD-10-CM | POA: Diagnosis present

## 2016-10-19 DIAGNOSIS — E860 Dehydration: Secondary | ICD-10-CM | POA: Diagnosis present

## 2016-10-19 DIAGNOSIS — Z79899 Other long term (current) drug therapy: Secondary | ICD-10-CM | POA: Diagnosis not present

## 2016-10-19 LAB — URINE CULTURE
CULTURE: NO GROWTH
SPECIAL REQUESTS: NORMAL

## 2016-10-19 LAB — GLUCOSE, CAPILLARY
GLUCOSE-CAPILLARY: 352 mg/dL — AB (ref 65–99)
GLUCOSE-CAPILLARY: 160 mg/dL — AB (ref 65–99)
GLUCOSE-CAPILLARY: 81 mg/dL (ref 65–99)
Glucose-Capillary: 196 mg/dL — ABNORMAL HIGH (ref 65–99)

## 2016-10-19 LAB — COMPREHENSIVE METABOLIC PANEL
ALBUMIN: 3.3 g/dL — AB (ref 3.5–5.0)
ALT: 65 U/L — AB (ref 17–63)
AST: 123 U/L — AB (ref 15–41)
Alkaline Phosphatase: 132 U/L — ABNORMAL HIGH (ref 38–126)
Anion gap: 9 (ref 5–15)
BUN: 13 mg/dL (ref 6–20)
CHLORIDE: 103 mmol/L (ref 101–111)
CO2: 28 mmol/L (ref 22–32)
CREATININE: 0.73 mg/dL (ref 0.61–1.24)
Calcium: 8.8 mg/dL — ABNORMAL LOW (ref 8.9–10.3)
GFR calc Af Amer: >60 mL/min (ref >60)
GFR calc non Af Amer: >60 mL/min (ref >60)
GLUCOSE: 177 mg/dL — AB (ref 65–99)
Potassium: 3.7 mmol/L (ref 3.5–5.1)
Sodium: 140 mmol/L (ref 135–145)
Total Bilirubin: 1.6 mg/dL — ABNORMAL HIGH (ref 0.3–1.2)
Total Protein: 6.3 g/dL — ABNORMAL LOW (ref 6.5–8.1)

## 2016-10-19 LAB — RPR, QUANT+TP ABS (REFLEX)
Rapid Plasma Reagin, Quant: 1:2 {titer} — ABNORMAL HIGH
T Pallidum Abs: POSITIVE — AB

## 2016-10-19 LAB — CK: Total CK: 701 U/L — ABNORMAL HIGH (ref 49–397)

## 2016-10-19 LAB — CBC
HCT: 40.5 % (ref 39.0–52.0)
Hemoglobin: 14.2 g/dL (ref 13.0–17.0)
MCH: 31.2 pg (ref 26.0–34.0)
MCHC: 35.1 g/dL (ref 30.0–36.0)
MCV: 89 fL (ref 78.0–100.0)
PLATELETS: 236 10*3/uL (ref 150–400)
RBC: 4.55 MIL/uL (ref 4.22–5.81)
RDW: 12.7 % (ref 11.5–15.5)
WBC: 11 10*3/uL — ABNORMAL HIGH (ref 4.0–10.5)

## 2016-10-19 LAB — HIV ANTIBODY (ROUTINE TESTING W REFLEX): HIV Screen 4th Generation wRfx: NONREACTIVE

## 2016-10-19 LAB — RPR: RPR: REACTIVE — AB

## 2016-10-19 MED ORDER — LORAZEPAM 1 MG PO TABS: 1.0000 mg | ORAL_TABLET | Freq: Four times a day (QID) | ORAL | Status: DC | PRN

## 2016-10-19 MED ORDER — THIAMINE HCL 100 MG/ML IJ SOLN
100.0000 mg | Freq: Every day | INTRAMUSCULAR | Status: DC
  Administered 2016-10-19 – 2016-10-21 (×3): 100 mg via INTRAVENOUS
  Filled 2016-10-19 (×3): qty 2

## 2016-10-19 MED ORDER — HYDRALAZINE HCL 20 MG/ML IJ SOLN: 5.0000 mg | Freq: Three times a day (TID) | INTRAMUSCULAR | Status: DC | PRN

## 2016-10-19 MED ORDER — FOLIC ACID 5 MG/ML IJ SOLN
1.0000 mg | Freq: Every day | INTRAMUSCULAR | Status: DC
  Administered 2016-10-19 – 2016-10-27 (×9): 1 mg via INTRAVENOUS
  Filled 2016-10-19 (×14): qty 0.2

## 2016-10-19 MED ORDER — LORAZEPAM 2 MG/ML IJ SOLN
1.0000 mg | Freq: Four times a day (QID) | INTRAMUSCULAR | Status: DC | PRN
  Administered 2016-10-19 – 2016-10-20 (×3): 1 mg via INTRAVENOUS
  Filled 2016-10-19 (×3): qty 1

## 2016-10-19 MED ORDER — HYDRALAZINE HCL 20 MG/ML IJ SOLN
5.0000 mg | Freq: Once | INTRAMUSCULAR | Status: AC
  Administered 2016-10-19: 5 mg via INTRAVENOUS
  Filled 2016-10-19: qty 1

## 2016-10-19 NOTE — Progress Notes (Signed)
Paged physician re: HTN - BP 193/98; requested anti-hypertensive.  Patient sleeping at the moment.
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
## 2016-10-19 NOTE — Progress Notes (Signed)
   Subjective: [name] is minimally responsive, he groans and moves all extremities to painful stimuli when seen on rounds early this morning. When seen this afternoon we heard from his sitter that he had been much more responsive and was agitated before he was given ativan and returned to being asleep and less responsive.   Objective:  Vital signs in last 24 hours: Vitals:   10/19/16 0822 10/19/16 0915 10/19/16 1200 10/19/16 1250  BP: (!) 193/98 (!) 157/89 139/76 (!) 161/85  Pulse: 70   71  Resp: 18   18  Temp: 98.7 F (37.1 C)   97.5 F (36.4 C)  TempSrc: Oral   Oral  SpO2: 100%   99%  Weight:      Height:       Physical Exam  Constitutional: He is well-developed, well-nourished, and in no distress. No distress.  HENT:  Head: Normocephalic.  Multiple facial abrasions   Eyes: Conjunctivae are normal. Right eye exhibits no discharge. Left eye exhibits no discharge. No scleral icterus.  Right pupil smaller than left, left pupil is sluggish to light   Cardiovascular: Normal rate and regular rhythm.   No murmur heard. Pulmonary/Chest: Effort normal and breath sounds normal. No respiratory distress.  Abdominal: Soft. Bowel sounds are normal. He exhibits no distension. There is no guarding.  Neurological:  He withdraws to painful stimuli, will not open his eyes on command, does not respond verbally   Skin: Skin is warm and dry. He is not diaphoretic.  Multiple facial, shoulder, and knee abrasions    Assessment/Plan:  Active Problems:   Rhabdomyolysis   Facial fracture due to fall (HCC)   Hypokalemia   Substance abuse   Diabetes (HCC)  Rhabdomyolysis  CK improved to 700 today with IV fluids overnight. Creatinine WNL on morning labs.  - continue IV fluids   Facial fractures  Still unable to assess for diplopia.  - continue conservative management   Hypokalemia  Hyponatremia  Lactic acidosis Resolved   Substance abuse  On CIWA protocol, he remains responsive only  to painful stimuli but seems to be waking up as ativan wears off. His hypothermia overnight is concerning. We have considered multiple differentials infection, encephalitis, post ictal state, and status epilepticus but believe this is most likely related to alcohol withdrawal.  -Hydralizine PRN SBP >180  - CIWA protocol  -IV thiamine and folic acid, MVM   Diabetes  - continue ISS with HS coverage    Dispo: Anticipated discharge in approximately 3-4 day(s).   Eulah PontBlum, Shadi Sessler, MD 10/19/2016, 3:40 PM Pager: [phone]

## 2016-10-19 NOTE — Progress Notes (Signed)
BP down to 157/89 s/p Hydralazine 5mg  IVP.  Will continue to monitor.
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
## 2016-10-20 LAB — GLUCOSE, CAPILLARY
GLUCOSE-CAPILLARY: 86 mg/dL (ref 65–99)
GLUCOSE-CAPILLARY: 105 mg/dL — AB (ref 65–99)
Glucose-Capillary: 129 mg/dL — ABNORMAL HIGH (ref 65–99)
Glucose-Capillary: 149 mg/dL — ABNORMAL HIGH (ref 65–99)

## 2016-10-20 LAB — HEPATIC FUNCTION PANEL
ALK PHOS: 114 U/L (ref 38–126)
ALT: 57 U/L (ref 17–63)
AST: 74 U/L — ABNORMAL HIGH (ref 15–41)
Albumin: 3.7 g/dL (ref 3.5–5.0)
BILIRUBIN DIRECT: 0.3 mg/dL (ref 0.1–0.5)
BILIRUBIN INDIRECT: 1.4 mg/dL — AB (ref 0.3–0.9)
BILIRUBIN TOTAL: 1.7 mg/dL — AB (ref 0.3–1.2)
Total Protein: 7 g/dL (ref 6.5–8.1)

## 2016-10-20 LAB — BASIC METABOLIC PANEL
Anion gap: 13 (ref 5–15)
BUN: 7 mg/dL (ref 6–20)
CHLORIDE: 101 mmol/L (ref 101–111)
CO2: 23 mmol/L (ref 22–32)
Calcium: 8.7 mg/dL — ABNORMAL LOW (ref 8.9–10.3)
Creatinine, Ser: 0.6 mg/dL — ABNORMAL LOW (ref 0.61–1.24)
GFR calc non Af Amer: >60 mL/min (ref >60)
Glucose, Bld: 88 mg/dL (ref 65–99)
POTASSIUM: 3 mmol/L — AB (ref 3.5–5.1)
SODIUM: 137 mmol/L (ref 135–145)

## 2016-10-20 LAB — CK: CK TOTAL: 799 U/L — AB (ref 49–397)

## 2016-10-20 LAB — HEMOGLOBIN A1C
Hgb A1c MFr Bld: >15.5 % — ABNORMAL HIGH (ref 4.8–5.6)
Mean Plasma Glucose: >398 mg/dL

## 2016-10-20 LAB — HEPATITIS C ANTIBODY: HCV Ab: >11 s/co ratio — ABNORMAL HIGH (ref 0.0–0.9)

## 2016-10-20 MED ORDER — POTASSIUM CHLORIDE 10 MEQ/100ML IV SOLN
10.0000 meq | INTRAVENOUS | Status: AC
  Administered 2016-10-20 (×6): 10 meq via INTRAVENOUS
  Filled 2016-10-20 (×6): qty 100

## 2016-10-20 MED ORDER — POTASSIUM CHLORIDE 10 MEQ/50ML IV SOLN
10.0000 meq | INTRAVENOUS | Status: DC
  Filled 2016-10-20 (×6): qty 50

## 2016-10-20 MED ORDER — LORAZEPAM 2 MG/ML IJ SOLN
0.5000 mg | Freq: Once | INTRAMUSCULAR | Status: AC
  Administered 2016-10-20: 0.5 mg via INTRAVENOUS
  Filled 2016-10-20: qty 1

## 2016-10-20 NOTE — Progress Notes (Signed)
Internal Medicine Attending:   I saw and examined the patient. I reviewed the resident's note and I agree with the resident's findings and plan as documented in the resident's note. He is more interactive today, he is able to answer many of our questions with repeated attempts.  He generally follows commands however very slowly and needs repeated instructions.  It is still difficult for me to assess if he has normal extra ocular motion.  I am also unclear if he is having any diplopia as when I assessed this he gave me 3 different answers back to back.  At this point I think his encephalopathy is due to intoxication and appears to be slowly improving, I do not think that he is going through serious alcohol withdrawal, we can monitor for this but I think that it is best to discontinue the ativan for now as it appears that he is sensitive to even the 1mg  dose.

## 2016-10-20 NOTE — Progress Notes (Signed)
   Subjective: Mr. Gerrit HallsKelsor is responsive today, he is oriented to person. When asked to open his eyes he is not able to follow that command. Reports from overnight are that he has been more awake between ativan doses.   Objective:  Vital signs in last 24 hours: Vitals:   10/19/16 0915 10/19/16 1200 10/19/16 1250 10/19/16 2306  BP: (!) 157/89 139/76 (!) 161/85 (!) 159/98  Pulse:   71 80  Resp:   18   Temp:   97.5 F (36.4 C)   TempSrc:   Oral   SpO2:   99%   Weight:      Height:       Physical Exam  Constitutional: He is well-developed, well-nourished, and in no distress. No distress.  HENT:  Head: Normocephalic.  Multiple facial abrasions   Eyes: Conjunctivae are normal. Right eye exhibits no discharge. Left eye exhibits no discharge. No scleral icterus.  Right pupil smaller than left, left pupil is sluggish to light   Cardiovascular: Normal rate and regular rhythm.   No murmur heard. Pulmonary/Chest: Effort normal and breath sounds normal. No respiratory distress.  Abdominal: Soft. Bowel sounds are normal. He exhibits no distension. There is no guarding.  Neurological:  He withdraws to painful stimuli, will not open his eyes on command, does not respond verbally   Skin: Skin is warm and dry. He is not diaphoretic.  Multiple facial, shoulder, and knee abrasions    Assessment/Plan:  Principal Problem:   Acute encephalopathy Active Problems:   Rhabdomyolysis   Facial fracture due to fall (HCC)   Hypokalemia   Substance abuse   Diabetes (HCC)  Acute encephalopathy  He has had improvement in communication since stopping ativan. Concussion may be a component of his presentation and is likely considering the facial trauma that he suffered.  - avoid sedating medications -continue to monitor and treat reversible medical conditions   Rhabdomyolysis  CK improved yesterday but has worsened somewhat today. It should be declining by 40% daily to mark the cessation of muscle  injury. Creatinine WNL on morning labs.  - increase NS to 175 cc/hr  - trend CK  - daily bmp   Facial fractures  Still unable to assess for diplopia, he will not open his eyes on command. If he describes diplopia we will need to consult opthalmology to assess for entrapment.  - continue conservative management   Hypokalemia  -Repleating   Hyponatremia  Lactic acidosis Resolved   Substance abuse  On CIWA protocol, he is more responsive and awake now that ativan is wearing off. I think he may have a lower tolerance for ativan and I don't think the entirety of his clinical picture is consistent with alcohol withdrawal. I worry that the ativan he has been receiving is inhibiting our ability to completely assess his neuro status and vision. We will hold off on ativan  -Hydralizine PRN SBP >180  - CIWA monitoring  -IV thiamine and folic acid, MVM  - avoid sedating medications   Diabetes  - continue ISS with HS coverage   Dispo: Anticipated discharge in approximately 3-4 day(s).   Eulah PontBlum, Shelena Castelluccio, MD 10/20/2016, 12:35 PM Pager: [phone]

## 2016-10-21 DIAGNOSIS — E119 Type 2 diabetes mellitus without complications: Secondary | ICD-10-CM

## 2016-10-21 DIAGNOSIS — G9340 Encephalopathy, unspecified: Secondary | ICD-10-CM

## 2016-10-21 DIAGNOSIS — M6282 Rhabdomyolysis: Secondary | ICD-10-CM

## 2016-10-21 DIAGNOSIS — S0292XA Unspecified fracture of facial bones, initial encounter for closed fracture: Secondary | ICD-10-CM

## 2016-10-21 DIAGNOSIS — A530 Latent syphilis, unspecified as early or late: Secondary | ICD-10-CM

## 2016-10-21 DIAGNOSIS — F1910 Other psychoactive substance abuse, uncomplicated: Secondary | ICD-10-CM

## 2016-10-21 DIAGNOSIS — X58XXXA Exposure to other specified factors, initial encounter: Secondary | ICD-10-CM

## 2016-10-21 DIAGNOSIS — I10 Essential (primary) hypertension: Secondary | ICD-10-CM

## 2016-10-21 DIAGNOSIS — R768 Other specified abnormal immunological findings in serum: Secondary | ICD-10-CM

## 2016-10-21 DIAGNOSIS — E876 Hypokalemia: Secondary | ICD-10-CM

## 2016-10-21 LAB — GLUCOSE, CAPILLARY
GLUCOSE-CAPILLARY: 192 mg/dL — AB (ref 65–99)
GLUCOSE-CAPILLARY: 79 mg/dL (ref 65–99)
Glucose-Capillary: 187 mg/dL — ABNORMAL HIGH (ref 65–99)

## 2016-10-21 LAB — COMPREHENSIVE METABOLIC PANEL
ALT: 55 U/L (ref 17–63)
ANION GAP: 12 (ref 5–15)
AST: 82 U/L — ABNORMAL HIGH (ref 15–41)
Albumin: 3.3 g/dL — ABNORMAL LOW (ref 3.5–5.0)
Alkaline Phosphatase: 100 U/L (ref 38–126)
BILIRUBIN TOTAL: 2.1 mg/dL — AB (ref 0.3–1.2)
BUN: <5 mg/dL — ABNORMAL LOW (ref 6–20)
CO2: 27 mmol/L (ref 22–32)
Calcium: 8.6 mg/dL — ABNORMAL LOW (ref 8.9–10.3)
Chloride: 96 mmol/L — ABNORMAL LOW (ref 101–111)
Creatinine, Ser: 0.62 mg/dL (ref 0.61–1.24)
GFR calc non Af Amer: >60 mL/min (ref >60)
GLUCOSE: 156 mg/dL — AB (ref 65–99)
POTASSIUM: 3.4 mmol/L — AB (ref 3.5–5.1)
Sodium: 135 mmol/L (ref 135–145)
TOTAL PROTEIN: 6.8 g/dL (ref 6.5–8.1)

## 2016-10-21 LAB — VITAMIN B12: VITAMIN B 12: 1155 pg/mL — AB (ref 180–914)

## 2016-10-21 LAB — TSH: TSH: 2.817 u[IU]/mL (ref 0.350–4.500)

## 2016-10-21 LAB — CK: Total CK: 1208 U/L — ABNORMAL HIGH (ref 49–397)

## 2016-10-21 MED ORDER — THIAMINE HCL 100 MG/ML IJ SOLN
200.0000 mg | Freq: Three times a day (TID) | INTRAMUSCULAR | Status: AC
  Administered 2016-10-21 – 2016-10-24 (×9): 200 mg via INTRAVENOUS
  Filled 2016-10-21 (×9): qty 2

## 2016-10-21 MED ORDER — HALOPERIDOL LACTATE 5 MG/ML IJ SOLN
2.0000 mg | Freq: Once | INTRAMUSCULAR | Status: AC
  Administered 2016-10-21: 2 mg via INTRAVENOUS
  Filled 2016-10-21: qty 1

## 2016-10-21 MED ORDER — LORAZEPAM 2 MG/ML IJ SOLN
1.0000 mg | Freq: Once | INTRAMUSCULAR | Status: AC
  Administered 2016-10-21: 1 mg via INTRAVENOUS

## 2016-10-21 MED ORDER — LORAZEPAM 2 MG/ML IJ SOLN
0.5000 mg | Freq: Once | INTRAMUSCULAR | Status: AC
  Administered 2016-10-21: 0.5 mg via INTRAVENOUS
  Filled 2016-10-21: qty 1

## 2016-10-21 MED ORDER — POTASSIUM CHLORIDE CRYS ER 20 MEQ PO TBCR
40.0000 meq | EXTENDED_RELEASE_TABLET | Freq: Once | ORAL | Status: AC
  Administered 2016-10-21: 40 meq via ORAL

## 2016-10-21 MED ORDER — LORAZEPAM 2 MG/ML IJ SOLN
INTRAMUSCULAR | Status: AC
  Filled 2016-10-21: qty 1

## 2016-10-21 MED ORDER — AMLODIPINE BESYLATE 5 MG PO TABS
5.0000 mg | ORAL_TABLET | Freq: Every day | ORAL | Status: DC
  Administered 2016-10-21 – 2016-10-27 (×7): 5 mg via ORAL
  Filled 2016-10-21 (×7): qty 1

## 2016-10-21 NOTE — Progress Notes (Signed)
Subjective:  Patient seen and examined this morning.  He is minimally more responsive today than yesterday although only alert to person at this time.  He received 1mg  Ativan overnight plus 2mg  Haldol this morning for agitation.  On rounds he is calm, lying in bed, intermittently snoring.  Objective:  Vital signs in last 24 hours: Vitals:   10/19/16 2306 10/20/16 1314 10/21/16 0624 10/21/16 1245  BP: (!) 159/98 (!) 168/86 (!) 179/86 (!) 161/76  Pulse: 80 80 83   Resp:   20   Temp:   97.7 F (36.5 C)   TempSrc:   Oral   SpO2:      Weight:      Height:       Physical Exam  Constitutional: He is well-developed, well-nourished, and in no distress. No distress.  HENT:  Head: Normocephalic.  Multiple facial abrasions   Eyes: Conjunctivae are normal. Right eye exhibits no discharge. Left eye exhibits no discharge. No scleral icterus.  Right pupil smaller than left w/ less reacivity, left pupil is sluggish to light   Cardiovascular: Normal rate and regular rhythm.   No murmur heard. Pulmonary/Chest: Effort normal. No respiratory distress.  Abdominal: Soft. Bowel sounds are normal. He exhibits no distension. There is no guarding.  Neurological:  He is answering more questions appropriately today and will follow some basic commands, however, is still encephalopathic.  He does not open his eyes on command.  He is alert only to himself.  Skin: Skin is warm and dry. He is not diaphoretic.  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  At this time, although his encephalopathy may be due to intoxication, it is  reasonable to consider other causes. - Check TSH - Check B12, Folate - RPR was reactive with a low 1:2 titer.  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  Although treponemal tests usually remain positive after infection, titers of non-treponemal assays decline following successful therapy and usually revert to non-reactive over time.  Thus, for patients with a history of treated syphilis, the presence of a positive non-treponemal test indicates a new infection, an evolving response to recent treatment, treatment failure, or the presence of a serofast state.  If this were new infection, one would not suspect neuro involvement this early.  A new syphilis infection is diagnosed when quantitative testing using a non-treponemal test reveals a fourfold or greater increase in titer from the individual's prior post-treatment test.  Consider contacting [facility] or [facility] or prior treatment records. - Start treatment doses for Wernicke's with Thiamine 200mg  TID x 3 days.  Monitor for improvement.  Rhabdomyolysis  CK 1200 today. It should be declining by 40% daily to mark the cessation of muscle injury. Creatinine WNL on morning labs today.  - increase NS to 200 cc/hr today - trend CK  - daily BMET  Facial fractures  Still unable to assess for diplopia, he will not open his eyes on command. If he describes diplopia we will need to consult opthalmology to assess for entrapment.  - continue conservative management   Hypokalemia  -Repleting   Substance abuse  On CIWA protocol,  attempting to avoid further sedating medications as he does not appear to be going through serious EtOH withdrawal at this time. - CIWA monitoring  - IV thiamine and folic acid, MVM  - avoid sedating medications   Diabetes  - continue ISS with HS coverage   HTN - has been consistently hypertensive in the 160-170's - add amlodipine 5mg  daily  Positive HCV Ab - check HCV RNA   Dispo: Anticipated discharge in approximately 3-4  day(s).   Gwynn BurlyWallace, Kinzly Pierrelouis, DO 10/21/2016, 1:48 PM Pager: [phone]

## 2016-10-21 NOTE — Progress Notes (Signed)
Internal Medicine Attending:   I saw and examined the patient. I reviewed the resident's note and I agree with the resident's findings and plan as documented in the resident's note.  Patient remains confused and is only oriented to person on examination this morning. Overnight he received 1 mg of Ativan and 2 mg of Haldol for agitation. He is sleeping in bed when the rounded on him this morning. The etiology of his encephalopathy remains uncertain at this time. It was initially thought to be secondary to intoxication however, he does not seem to be improving as rapidly as we would expect from confusion secondary to intoxication alone. Given his history of alcohol abuse I think that it is possible that his confusion may be secondary to underlying Warnicke's/Korsakoff syndrome. We'll start patient on therapeutic dose of thiamine and monitor. Patient also noted to have an RPR with a low titer positivity. It is unlikely to be active syphilis and this more likely represents somebody who has been treated for syphilis in the past. TSH is within normal limits and he is not B12 deficient. He has no active signs of an infection and remains afebrile with blood cultures negative to date. It is unlikely to be secondary to infectious etiology. We will continue to monitor closely.

## 2016-10-21 NOTE — Progress Notes (Signed)
Patient has been uncooperative with neuro checks, patient is combative when trying to assess him.  Will continue to monitor.  Macarthur CritchleyMarie Delan Ksiazek, RN
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
## 2016-10-22 LAB — T4, FREE: FREE T4: 1.36 ng/dL — AB (ref 0.61–1.12)

## 2016-10-22 LAB — COMPREHENSIVE METABOLIC PANEL
ALT: 55 U/L (ref 17–63)
ANION GAP: 10 (ref 5–15)
AST: 85 U/L — ABNORMAL HIGH (ref 15–41)
Albumin: 3.3 g/dL — ABNORMAL LOW (ref 3.5–5.0)
Alkaline Phosphatase: 94 U/L (ref 38–126)
BILIRUBIN TOTAL: 1.4 mg/dL — AB (ref 0.3–1.2)
CO2: 31 mmol/L (ref 22–32)
Calcium: 8.3 mg/dL — ABNORMAL LOW (ref 8.9–10.3)
Chloride: 98 mmol/L — ABNORMAL LOW (ref 101–111)
Creatinine, Ser: 0.6 mg/dL — ABNORMAL LOW (ref 0.61–1.24)
Glucose, Bld: 146 mg/dL — ABNORMAL HIGH (ref 65–99)
POTASSIUM: 3.2 mmol/L — AB (ref 3.5–5.1)
Sodium: 139 mmol/L (ref 135–145)
TOTAL PROTEIN: 6.4 g/dL — AB (ref 6.5–8.1)

## 2016-10-22 LAB — GLUCOSE, CAPILLARY
GLUCOSE-CAPILLARY: 159 mg/dL — AB (ref 65–99)
Glucose-Capillary: 91 mg/dL (ref 65–99)
Glucose-Capillary: 125 mg/dL — ABNORMAL HIGH (ref 65–99)
Glucose-Capillary: 225 mg/dL — ABNORMAL HIGH (ref 65–99)

## 2016-10-22 LAB — CK: CK TOTAL: 1176 U/L — AB (ref 49–397)

## 2016-10-22 LAB — MAGNESIUM: Magnesium: 1.4 mg/dL — ABNORMAL LOW (ref 1.7–2.4)

## 2016-10-22 LAB — POTASSIUM: POTASSIUM: 3.2 mmol/L — AB (ref 3.5–5.1)

## 2016-10-22 MED ORDER — LORAZEPAM 1 MG PO TABS: 1.0000 mg | ORAL_TABLET | Freq: Four times a day (QID) | ORAL | Status: DC | PRN

## 2016-10-22 MED ORDER — MAGNESIUM SULFATE 2 GM/50ML IV SOLN
2.0000 g | Freq: Once | INTRAVENOUS | Status: AC
Start: 2016-10-22 — End: 2016-10-22
  Administered 2016-10-22: 2 g via INTRAVENOUS
  Filled 2016-10-22: qty 50

## 2016-10-22 MED ORDER — POTASSIUM CHLORIDE 10 MEQ/50ML IV SOLN
10.0000 meq | INTRAVENOUS | Status: DC | PRN
  Filled 2016-10-22: qty 50

## 2016-10-22 MED ORDER — LORAZEPAM 2 MG/ML IJ SOLN
1.0000 mg | Freq: Four times a day (QID) | INTRAMUSCULAR | Status: DC | PRN
  Administered 2016-10-22 (×2): 1 mg via INTRAVENOUS
  Filled 2016-10-22 (×2): qty 1

## 2016-10-22 MED ORDER — POTASSIUM CHLORIDE 10 MEQ/100ML IV SOLN
10.0000 meq | INTRAVENOUS | Status: AC
  Administered 2016-10-22 (×4): 10 meq via INTRAVENOUS
  Filled 2016-10-22 (×4): qty 100

## 2016-10-22 MED ORDER — POTASSIUM CHLORIDE CRYS ER 20 MEQ PO TBCR
40.0000 meq | EXTENDED_RELEASE_TABLET | Freq: Once | ORAL | Status: AC
  Administered 2016-10-22: 40 meq via ORAL
  Filled 2016-10-22: qty 2

## 2016-10-22 MED ORDER — POTASSIUM CHLORIDE 10 MEQ/50ML IV SOLN: 10.0000 meq | INTRAVENOUS | Status: DC | PRN

## 2016-10-22 NOTE — Progress Notes (Signed)
Subjective: This morning [name] is moving all extremities to painful stimulus but not verbally responding. His nurse reports that last night he was able to eat dinner and was conversational, he was disoriented and talking about going to the store. He did not report blurry vision at that time. Now he is sleeping comfortably, he is snoring and calm.   Objective:  Vital signs in last 24 hours: Vitals:   10/21/16 1403 10/21/16 2132 10/22/16 0458 10/22/16 0801  BP: (!) 148/93 132/77 (!) 163/129 (!) 162/80  Pulse: 72 81 83 78  Resp: 20 14 18    Temp: (!) 96.9 F (36.1 C) 98.6 F (37 C) 97.8 F (36.6 C)   TempSrc: Axillary     SpO2:  100% 100%   Weight:      Height:       Physical Exam  Constitutional: He is well-developed, well-nourished, and in no distress. No distress.  HENT:  Head: Normocephalic.  Multiple facial abrasions   Eyes: Conjunctivae are normal. Right eye exhibits no discharge. Left eye exhibits no discharge. No scleral icterus.  Right pupil smaller than left and non reactive to light, left pupil is sluggish to light   Cardiovascular: Normal rate and regular rhythm.   No murmur heard. Pulmonary/Chest: Effort normal and breath sounds normal. No respiratory distress.  Abdominal: Soft. Bowel sounds are normal. He exhibits no distension. There is no guarding.  Neurological:  He withdraws to painful stimuli, does not respond verbally   Skin: Skin is warm and dry. He is not diaphoretic.  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 He is not tachycardic but has been hypertensive at times (SBP 160 this morning) and was hypothermic again overnight (temp 96.9.) His baseline is unknown and the cause of his acute encephalopathy is unclear. He has not had any seizure like activity, Vitamin B12 level was above the normal limit and TSH was minimally elevated at 2.8. Its likely that he suffered a concussion at some point prior to presentation.  I think he  - avoid sedating medications -continue to monitor and treat reversible medical conditions  -follow up folate  -follow up Free T4  - continue IV thiamine 200 mg   Hypokalemia  K 3.2 this morning - Repleating  - Follow up Mag  - follow up potassium lab this evening   Rhabdomyolysis  CK remains elevated today. It should be declining by 40% daily to mark the cessation of muscle injury. Creatinine remains WNL.  -increase NS to 225 cc/hr  - trend CK  - daily bmp   Facial fractures  Still unable to assess for diplopia on rounds due to sedation but he was describing things that he saw in his room for nurses yesterday evening. If he describes diplopia we will need to consult opthalmology to assess for entrapment.  - continue conservative management   Hyponatremia  Lactic acidosis Resolved   Substance abuse  I do not believe that he is in serious alcohol withdrawal at this point. We  will continue to avoid overly sedating medications. Question if his presentation may be in part related to wernicke's encephalopathy.  -Hydralizine PRN SBP >180  -IV thiamine and folic acid, MVM  - avoid sedating medications   Hypertension  This may be a new diagnosis.  - continue amlodipine 5 mg daily - hydralazine PRN SBP >180   Diabetes  - continue ISS with HS coverage   Dispo: Anticipated discharge in approximately 3-4 day(s).   Eulah Pont, MD 10/22/2016, 8:52 AM Pager: [phone]

## 2016-10-23 ENCOUNTER — Encounter (HOSPITAL_COMMUNITY): Payer: Self-pay | Admitting: *Deleted

## 2016-10-23 LAB — MAGNESIUM: Magnesium: 1.7 mg/dL (ref 1.7–2.4)

## 2016-10-23 LAB — COMPREHENSIVE METABOLIC PANEL
ALBUMIN: 3 g/dL — AB (ref 3.5–5.0)
ALK PHOS: 88 U/L (ref 38–126)
ALT: 49 U/L (ref 17–63)
AST: 57 U/L — AB (ref 15–41)
Anion gap: 8 (ref 5–15)
BILIRUBIN TOTAL: 1.2 mg/dL (ref 0.3–1.2)
BUN: 5 mg/dL — AB (ref 6–20)
CALCIUM: 8 mg/dL — AB (ref 8.9–10.3)
CO2: 29 mmol/L (ref 22–32)
Chloride: 100 mmol/L — ABNORMAL LOW (ref 101–111)
Creatinine, Ser: 0.61 mg/dL (ref 0.61–1.24)
GFR calc Af Amer: >60 mL/min (ref >60)
GFR calc non Af Amer: >60 mL/min (ref >60)
GLUCOSE: 210 mg/dL — AB (ref 65–99)
Potassium: 3.4 mmol/L — ABNORMAL LOW (ref 3.5–5.1)
Sodium: 137 mmol/L (ref 135–145)
TOTAL PROTEIN: 6 g/dL — AB (ref 6.5–8.1)

## 2016-10-23 LAB — CULTURE, BLOOD (ROUTINE X 2)
CULTURE: NO GROWTH
CULTURE: NO GROWTH
SPECIAL REQUESTS: ADEQUATE
Special Requests: ADEQUATE

## 2016-10-23 LAB — GLUCOSE, CAPILLARY
GLUCOSE-CAPILLARY: 255 mg/dL — AB (ref 65–99)
GLUCOSE-CAPILLARY: 202 mg/dL — AB (ref 65–99)
Glucose-Capillary: 267 mg/dL — ABNORMAL HIGH (ref 65–99)
Glucose-Capillary: 210 mg/dL — ABNORMAL HIGH (ref 65–99)

## 2016-10-23 LAB — CK: Total CK: 625 U/L — ABNORMAL HIGH (ref 49–397)

## 2016-10-23 MED ORDER — POTASSIUM CHLORIDE CRYS ER 20 MEQ PO TBCR
40.0000 meq | EXTENDED_RELEASE_TABLET | Freq: Two times a day (BID) | ORAL | Status: DC
  Administered 2016-10-23 – 2016-10-24 (×3): 40 meq via ORAL
  Filled 2016-10-23 (×3): qty 2

## 2016-10-23 MED ORDER — ENSURE ENLIVE PO LIQD
237.0000 mL | Freq: Two times a day (BID) | ORAL | Status: DC
  Administered 2016-10-23 – 2016-10-24 (×3): 237 mL via ORAL

## 2016-10-23 MED ORDER — ENOXAPARIN SODIUM 30 MG/0.3ML ~~LOC~~ SOLN
30.0000 mg | SUBCUTANEOUS | Status: DC
  Administered 2016-10-23 – 2016-10-26 (×4): 30 mg via SUBCUTANEOUS
  Filled 2016-10-23 (×4): qty 0.3

## 2016-10-23 MED ORDER — INSULIN ASPART 100 UNIT/ML ~~LOC~~ SOLN
3.0000 [IU] | Freq: Three times a day (TID) | SUBCUTANEOUS | Status: DC
  Administered 2016-10-23 – 2016-10-24 (×2): 3 [IU] via SUBCUTANEOUS

## 2016-10-23 NOTE — Progress Notes (Signed)
Patient does not follow commands and will not cooperate with neuro checks.  He will open his eyes for a short time but tries to hit you when you are trying to assess.  Patient keeps asking for chocolate.  Tried to educate him on his diet and diabetes.  Patient is not accepting of education.  Will continue to monitor.  Macarthur CritchleyMarie Prosper Paff, RN

## 2016-10-23 NOTE — Progress Notes (Signed)
Results for Antoine PocheKELSOR, Breylin (MRN [id number]) as of 10/23/2016 13:15  Ref. Range 10/22/2016 12:02 10/22/2016 16:55 10/22/2016 21:48 10/23/2016 08:08 10/23/2016 12:27  Glucose-Capillary Latest Ref Range: 65 - 99 mg/dL 91 409125 (H) 811225 (H) 914267 (H) 255 (H)  Noted that blood sugars have started to increase and are greater than 180 mg/dl.  Recommend adding Novolog 3 units TID with meals if patient eats at least 50% of meals and blood sugars continue to be elevated.   Smith MinceKendra Seanpatrick Maisano RN BSN CDE Diabetes Coordinator Pager: [phone]  8am-5pm

## 2016-10-23 NOTE — Progress Notes (Signed)
Internal Medicine Attending:   I saw and examined the patient. I reviewed the resident's note and I agree with the resident's findings and plan as documented in the resident's note. Sean Walsh was more awake on rounds today.  I am unclear about his baseline status but he gave us his mother's number and permission to contact her, it appears he is usually not fully oriented and does have a long history of substance abuse.  On my exam today he is oriented grossly to person but not to place (reported church/ [place], [place]).  He has no overall complaints, and reports that he is enjoying his meals.  Overall it appears he is clinically improving. We will obtian PT and OT consults today and he will likely need SNF placement.

## 2016-10-23 NOTE — Progress Notes (Signed)
Subjective: This morning Sean Walsh conversational with [place] during rounds. He denies any pain or concerns. He denies diplopia.    Objective:  Vital signs in last 24 hours: Vitals:   10/22/16 2150 10/23/16 0525 10/23/16 1012 10/23/16 1440  BP: 140/90 (!) 168/105 (!) 162/82 132/68  Pulse: 88 89  90  Resp: 18 17  20   Temp: 98.1 F (36.7 C) 97.8 F (36.6 C)  99 F (37.2 C)  TempSrc: Oral Oral  Oral  SpO2: 100% 99%  100%  Weight:      Height:       Physical Exam  Constitutional: He is well-developed, well-nourished, and in no distress. No distress.  HENT:  Head: Normocephalic.  Multiple facial abrasions   Eyes: Conjunctivae are normal. Right eye exhibits no discharge. Left eye exhibits no discharge. No scleral icterus.  No diplopia   Cardiovascular: Normal rate and regular rhythm.   No murmur heard. Pulmonary/Chest: Effort normal and breath sounds normal. No respiratory distress.  Abdominal: Soft. Bowel sounds are normal. He exhibits no distension. There is no guarding.  Neurological: He is alert.  Oriented to person and place [place]") will not verbalize the time   Skin: Skin is warm and dry. He is not diaphoretic.  Multiple facial, shoulder, and knee abrasions    Assessment/Plan:  Principal Problem:   Acute encephalopathy Active Problems:   Rhabdomyolysis   Facial fracture due to fall (HCC)   Hypokalemia   Substance abuse   Diabetes (HCC)   HTN (hypertension)   HCV antibody positive   Positive RPR test  Acute encephalopathy  Sean Walsh is more alert and awake on exam today, he did not require any ativan overnight. I spoke with his social worker today, it seems that Sean Walsh. It sounds like he is non compliant with medical therapy but very organized at baseline. He has no known past hospital admissions for substance withdrawal but is known to use  cocaine. His PCP is Dr. Ina Homes at the [facility], [facility] [place] [phone]). His mother says that at baseline he has some forget fullness of the day and week and has followed with psychiatry for drug addiction in the past. His mother also mentions that he was describing some weakness and falling the day prior to his presentation to the ED.  His encephalopathy may be multifactorial. It is reassuring that he has improved so we will hold off on further imaging in this time. We will continue treating with thiamine for possible wernickes encephalopathy.  - avoid sedating medications -follow up folate  - continue IV thiamine 200 mg TID  -delirium precautions  -PT and OT eval and treatment   Hypokalemia  K 3.4 this morning - Repleating  - Follow up Mag and BMPtomorrow Am   Rhabdomyolysis  CK improved to 600 today. It should be declining by 40% daily to mark the cessation of muscle injury. Creatinine remains WNL.  - KVO  - trend CK  - daily bmp   Facial fractures  He denies diplopia today.  - continue conservative management   Hyponatremia  Lactic acidosis Resolved   Substance abuse  I do not believe that he is in serious alcohol withdrawal at this point. We will continue to avoid overly sedating medications. Question if his presentation may be in part related to wernicke's  encephalopathy.  -Hydralizine PRN SBP >180  -IV thiamine and folic acid, MVM  - avoid sedating medications   Hypertension  This was not previously treated.  -started amlodipine 5 mg daily - hydralazine PRN SBP >180   Diabetes  - continue ISS with HS coverage   Dispo: Anticipated discharge in approximately 1-2 day(s).   Eulah PontBlum, Koren Plyler, MD 10/23/2016, 3:35 PM Pager: [phone]

## 2016-10-24 ENCOUNTER — Encounter (HOSPITAL_COMMUNITY): Payer: Self-pay

## 2016-10-24 LAB — BASIC METABOLIC PANEL
ANION GAP: 8 (ref 5–15)
BUN: 7 mg/dL (ref 6–20)
CHLORIDE: 100 mmol/L — AB (ref 101–111)
CO2: 30 mmol/L (ref 22–32)
Calcium: 8.4 mg/dL — ABNORMAL LOW (ref 8.9–10.3)
Creatinine, Ser: 0.59 mg/dL — ABNORMAL LOW (ref 0.61–1.24)
GFR calc Af Amer: >60 mL/min (ref >60)
GLUCOSE: 324 mg/dL — AB (ref 65–99)
POTASSIUM: 3.7 mmol/L (ref 3.5–5.1)
Sodium: 138 mmol/L (ref 135–145)

## 2016-10-24 LAB — GLUCOSE, CAPILLARY
GLUCOSE-CAPILLARY: 348 mg/dL — AB (ref 65–99)
Glucose-Capillary: 491 mg/dL — ABNORMAL HIGH (ref 65–99)
Glucose-Capillary: 360 mg/dL — ABNORMAL HIGH (ref 65–99)
Glucose-Capillary: 412 mg/dL — ABNORMAL HIGH (ref 65–99)
Glucose-Capillary: 320 mg/dL — ABNORMAL HIGH (ref 65–99)

## 2016-10-24 LAB — MAGNESIUM: Magnesium: 1.7 mg/dL (ref 1.7–2.4)

## 2016-10-24 LAB — C DIFFICILE QUICK SCREEN W PCR REFLEX
C DIFFICILE (CDIFF) INTERP: NOT DETECTED
C DIFFICILE (CDIFF) TOXIN: NEGATIVE
C Diff antigen: NEGATIVE

## 2016-10-24 LAB — FOLATE RBC: HEMATOCRIT: 39.1 % (ref 37.5–51.0)

## 2016-10-24 LAB — CK: CK TOTAL: 325 U/L (ref 49–397)

## 2016-10-24 MED ORDER — INSULIN ASPART 100 UNIT/ML ~~LOC~~ SOLN
8.0000 [IU] | Freq: Three times a day (TID) | SUBCUTANEOUS | Status: DC
  Administered 2016-10-24 – 2016-10-25 (×4): 8 [IU] via SUBCUTANEOUS

## 2016-10-24 MED ORDER — INSULIN ASPART 100 UNIT/ML ~~LOC~~ SOLN
0.0000 [IU] | Freq: Every day | SUBCUTANEOUS | Status: DC
  Administered 2016-10-24: 4 [IU] via SUBCUTANEOUS

## 2016-10-24 MED ORDER — RISPERIDONE 1 MG PO TBDP
1.0000 mg | ORAL_TABLET | Freq: Once | ORAL | Status: AC
  Administered 2016-10-24: 1 mg via ORAL
  Filled 2016-10-24: qty 1

## 2016-10-24 MED ORDER — INSULIN ASPART 100 UNIT/ML ~~LOC~~ SOLN
0.0000 [IU] | Freq: Three times a day (TID) | SUBCUTANEOUS | Status: DC
  Administered 2016-10-24: 15 [IU] via SUBCUTANEOUS
  Administered 2016-10-25: 8 [IU] via SUBCUTANEOUS
  Administered 2016-10-25: 11 [IU] via SUBCUTANEOUS
  Administered 2016-10-25: 8 [IU] via SUBCUTANEOUS
  Administered 2016-10-26: 3 [IU] via SUBCUTANEOUS
  Administered 2016-10-26: 8 [IU] via SUBCUTANEOUS

## 2016-10-24 MED ORDER — INSULIN GLARGINE 100 UNIT/ML ~~LOC~~ SOLN
10.0000 [IU] | Freq: Every day | SUBCUTANEOUS | Status: DC
  Administered 2016-10-24: 10 [IU] via SUBCUTANEOUS
  Filled 2016-10-24: qty 0.1

## 2016-10-24 MED ORDER — BACITRACIN ZINC 500 UNIT/GM EX OINT
TOPICAL_OINTMENT | Freq: Two times a day (BID) | CUTANEOUS | Status: DC
  Administered 2016-10-24 – 2016-10-27 (×7): via TOPICAL
  Filled 2016-10-24: qty 28.35

## 2016-10-24 MED ORDER — THIAMINE HCL 100 MG/ML IJ SOLN: 250.0000 mg | Freq: Every day | INTRAMUSCULAR | Status: DC

## 2016-10-24 MED ORDER — GLUCERNA SHAKE PO LIQD
237.0000 mL | Freq: Three times a day (TID) | ORAL | Status: DC
  Administered 2016-10-24 – 2016-10-27 (×10): 237 mL via ORAL

## 2016-10-24 MED ORDER — INSULIN GLARGINE 100 UNIT/ML ~~LOC~~ SOLN
5.0000 [IU] | Freq: Once | SUBCUTANEOUS | Status: DC
  Filled 2016-10-24: qty 0.05

## 2016-10-24 MED ORDER — INSULIN GLARGINE 100 UNIT/ML ~~LOC~~ SOLN: 5.0000 [IU] | Freq: Every day | SUBCUTANEOUS | Status: DC

## 2016-10-24 MED ORDER — THIAMINE HCL 100 MG/ML IJ SOLN
250.0000 mg | Freq: Every day | INTRAMUSCULAR | Status: DC
  Administered 2016-10-25 – 2016-10-27 (×3): 250 mg via INTRAVENOUS
  Filled 2016-10-24 (×4): qty 4

## 2016-10-24 MED ORDER — VITAMIN B-1 100 MG PO TABS: 100.0000 mg | ORAL_TABLET | Freq: Three times a day (TID) | ORAL | Status: DC

## 2016-10-24 NOTE — Progress Notes (Signed)
OT Cancellation Note  Patient Details Name: Antoine Pocheierre Ludke MRN: [id number] DOB: 1955/11/14   Cancelled Treatment:    Reason Eval/Treat Not Completed: Fatigue/lethargy limiting ability to participate. Attempted x 2. Will follow.  Evern BioMayberry, Ever Halberg Lynn 10/24/2016, 3:48 PM  [phone]

## 2016-10-24 NOTE — Evaluation (Signed)
Physical Therapy Evaluation Patient Details Name: Sean Walsh MRN: [id number] DOB: 1956/01/21 Today's Date: 10/24/2016   History of Present Illness  Pt admitted with acute encephalopathy, rhabdomyolosis, facial fx due to all and hyponatremia. PMH: substance abuse  Clinical Impression  Pt admitted with/for the above complications of being found down on the floor for indeterminate time.  Pt requiring significant assist for basic mobility at this time.  Pt currently limited functionally due to the problems listed. ( See problems list.)   Pt will benefit from PT to maximize function and safety in order to get ready for next venue listed below.     Follow Up Recommendations SNF    Equipment Recommendations  Rolling walker with 5" wheels    Recommendations for Other Services       Precautions / Restrictions Precautions Precautions: Fall Restrictions Weight Bearing Restrictions: No      Mobility  Bed Mobility Overal bed mobility: Needs Assistance Bed Mobility: Supine to Sit     Supine to sit: Supervision        Transfers Overall transfer level: Needs assistance Equipment used: None;Rolling walker (2 wheeled) Transfers: Sit to/from Stand Sit to Stand: Min assist         General transfer comment: cues for hand placement and stability assist  Ambulation/Gait Ambulation/Gait assistance: Mod assist Ambulation Distance (Feet): 80 Feet Assistive device: Rolling walker (2 wheeled) Gait Pattern/deviations: Step-through pattern;Scissoring;Narrow base of support   Gait velocity interpretation: Below normal speed for age/gender General Gait Details: overall scissored staggered gait with narrowed BOS.  Pt with decreased ability to coordinate maneuvering of the RW and definitely too uncoordinated to ambulate with AD  Stairs            Wheelchair Mobility    Modified Rankin (Stroke Patients Only)       Balance Overall balance assessment: Needs assistance    Sitting balance-Leahy Scale: Fair       Standing balance-Leahy Scale: Poor Standing balance comment: required external support plus AD                             Pertinent Vitals/Pain Pain Assessment: Faces Faces Pain Scale: No hurt    Home Living Family/patient expects to be discharged to:: Unsure                 Additional Comments: pt states he lives alone in an apt, but he also says his Mom helps out ([place]  Per staff pt has a Scientist, product/process developmentCase/Social worker helping him out.    Prior Function Level of Independence: Independent               Hand Dominance   Dominant Hand: Right    Extremity/Trunk Assessment   Upper Extremity Assessment Upper Extremity Assessment: Defer to OT evaluation    Lower Extremity Assessment Lower Extremity Assessment: RLE deficits/detail;LLE deficits/detail RLE Deficits / Details: functional strength, but uncoordinated movement RLE Coordination: decreased fine motor LLE Deficits / Details: generalized weakness, but able to bear weight.  decreased coordination  LLE Coordination: decreased fine motor    Cervical / Trunk Assessment Cervical / Trunk Assessment: Normal  Communication   Communication: No difficulties (minimally conversant)  Cognition Arousal/Alertness: Awake/alert Behavior During Therapy: Impulsive Overall Cognitive Status: Impaired/Different from baseline Area of Impairment: Orientation;Attention;Memory;Following commands;Safety/judgement                 Orientation Level: Disoriented to;Place;Time;Situation (knew he was in  [place]) Current Attention Level: Sustained Memory: Decreased short-term memory Following Commands: Follows one step commands inconsistently;Follows one step commands with increased time Safety/Judgement: Decreased awareness of safety;Decreased awareness of deficits            General Comments      Exercises     Assessment/Plan    PT Assessment  Patient needs continued PT services  PT Problem List Decreased strength;Decreased activity tolerance;Decreased balance;Decreased mobility;Decreased coordination;Decreased safety awareness       PT Treatment Interventions Gait training;Functional mobility training;Therapeutic activities;Balance training;Patient/family education    PT Goals (Current goals can be found in the Care Plan section)  Acute Rehab PT Goals Patient Stated Goal: to eat PT Goal Formulation: With patient Time For Goal Achievement: 10/31/16 Potential to Achieve Goals: Fair    Frequency Min 3X/week   Barriers to discharge        Co-evaluation               AM-PAC PT "6 Clicks" Daily Activity  Outcome Measure Difficulty turning over in bed (including adjusting bedclothes, sheets and blankets)?: Total Difficulty moving from lying on back to sitting on the side of the bed? : Total Difficulty sitting down on and standing up from a chair with arms (e.g., wheelchair, bedside commode, etc,.)?: A Little Help needed moving to and from a bed to chair (including a wheelchair)?: A Lot Help needed walking in hospital room?: A Lot Help needed climbing 3-5 steps with a railing? : A Lot 6 Click Score: 11    End of Session Equipment Utilized During Treatment: Gait belt Activity Tolerance: Patient tolerated treatment well;Patient limited by fatigue Patient left: in chair;with call bell/phone within reach;with nursing/sitter in room Nurse Communication: Mobility status PT Visit Diagnosis: Unsteadiness on feet (R26.81);Difficulty in walking, not elsewhere classified (R26.2)    Time: 1650-1701 PT Time Calculation (min) (ACUTE ONLY): 11 min   Charges:   PT Evaluation $PT Eval Moderate Complexity: 1 Procedure     PT G Codes:        2016/11/07  Burt Bing, PT [phone]  (pager)  Eliseo Gum Aldo Sondgeroth 2016/11/07, 5:48 PM

## 2016-10-24 NOTE — Progress Notes (Addendum)
cbg 491, provider notified, orders written and will be administered    Per dr Obie DredgeBlum give lantus now

## 2016-10-24 NOTE — Progress Notes (Signed)
Internal Medicine Attending:   I saw and examined the patient. I reviewed the resident's note and I agree with the resident's findings and plan as documented in the resident's note. On rounds Mr Sean Walsh was perseverating on his hungar and request of a potato.  Otherwise he was now sitting up in bed and would answer some limited questions in between request for food.  I was informed that he did eat a full breakfast plate.  He has also had some hyperglycemia due to his increased oral intake.  We will obtain PT and OT consults today and work to discharge him to SNF.  We have adjusted his insulin regimen.

## 2016-10-24 NOTE — Progress Notes (Signed)
   Subjective: This morning [name] is more awake and conversational during rounds. He is sitting up an the side of the bed speaking with his sitter. He expresses that he is still hungry despite just finish a breakfast plate. He describes abdominal discomfort that improves with food. He describes paraspinal muscle tenderness. He denies blurred or double vision. His [facility] social worker Royston Sinner(Bryan Jackson) is in his room and updated on the plan for PT evaluation and SNF placement.    Objective:  Vital signs in last 24 hours: Vitals:   10/23/16 2157 10/24/16 0600 10/24/16 0853 10/24/16 0853  BP: 139/75 132/85 (!) 142/77 (!) 142/77  Pulse: 88 88  79  Resp:  18    Temp: 98.1 F (36.7 C) 98 F (36.7 C)    TempSrc: Oral Oral    SpO2: 100% 100%    Weight:      Height:       Physical Exam  Constitutional: He is well-developed, well-nourished, and in no distress. No distress.  HENT:  Head: Normocephalic.  Multiple facial abrasions   Eyes: Conjunctivae are normal. Right eye exhibits no discharge. Left eye exhibits no discharge. No scleral icterus.  No diplopia   Cardiovascular: Normal rate and regular rhythm.   No murmur heard. Pulmonary/Chest: Effort normal and breath sounds normal. No respiratory distress.  Abdominal: Soft. Bowel sounds are normal. He exhibits no distension. There is no tenderness. There is no guarding.  Neurological: He is alert.  Oriented to person and somewhat place ("[place] will not verbalize the time   Skin: Skin is warm and dry. He is not diaphoretic.  Multiple facial, shoulder, and knee abrasions    Assessment/Plan:  Principal Problem:   Acute encephalopathy Active Problems:   Rhabdomyolysis   Facial fracture due to fall (HCC)   Hypokalemia   Substance abuse   Diabetes (HCC)   HTN (hypertension)   HCV antibody positive   Positive RPR test  Acute encephalopathy  Resolving, [name] is more alert and awake on exam today, he was given a  dose of risperidone for agitation overnight. We will continue treating with thiamine for possible wernickes encephalopathy.  - avoid sedating medications -follow up folate  - deescalate thiamine to once daily dosing for the next 5 days then transition to oral after that  -delirium precautions  -PT and OT eval for SNF placement   Hypokalemia  K 3.7 this morning, responded well to po repletion.  - Follow up BMP tomorrow Am   Rhabdomyolysis  CK improved to 300 today, this suggest appropriate cessation of muscle injury. Creatinine remains WNL.  - KVO  - daily bmp   Facial fractures  - continue conservative management at discharge   Hyponatremia  Lactic acidosis Resolved   Substance abuse  Known hx of alcohol and cocaine use. He is not acutely intoxicated or in withdrawal from either of these.  -IV thiamine and folic acid, MVM  - avoid sedating medications   Hypertension  BP is well controlled.  -continue amlodipine 5 mg daily  Diabetes  CBG 300s since he has began eating.  - continue ISS with HS coverage  - start lantus 5 units qHS  - increased novolog to 8 units with meals   Dispo: Anticipated discharge in approximately 1-2 day(s).   Eulah PontBlum, Mylissa Lambe, MD 10/24/2016, 11:29 AM Pager: [phone]

## 2016-10-24 NOTE — Evaluation (Signed)
Occupational Therapy Evaluation Patient Details Name: Sean Walsh MRN: [id number] DOB: [birth year] Today's Date: 10/24/2016    History of Present Illness Pt admitted with acute encephalopathy, rhabdomyolosis, facial fx due to all and hyponatremia. PMH: substance abuse   Clinical Impression   Pt was living alone prior to admission, presumed independently. Pt presents with impaired cognition and poor standing balance requiring moderate assistance to ambulate within room. He was highly focused on eating throughout session, and minimally conversant. Pt requires min to total assist for ADL. He will require SNF upon discharge for further rehab. Will follow acutely.    Follow Up Recommendations  SNF;Supervision/Assistance - 24 hour    Equipment Recommendations       Recommendations for Other Services       Precautions / Restrictions Precautions Precautions: Fall Restrictions Weight Bearing Restrictions: No      Mobility Bed Mobility Overal bed mobility: Needs Assistance Bed Mobility: Supine to Sit     Supine to sit: Supervision        Transfers Overall transfer level: Needs assistance Equipment used: None Transfers: Sit to/from Stand Sit to Stand: Min               Balance Overall balance assessment: Needs assistance   Sitting balance-Leahy Scale: Fair       Standing balance-Leahy Scale: Poor                             ADL either performed or assessed with clinical judgement   ADL Overall ADL's : Needs assistance/impaired Eating/Feeding: Sitting;Minimal assistance   Grooming: Wash/dry hands;Wash/dry face;Maximal assistance;Sitting   Upper Body Bathing: Total assistance;Sitting   Lower Body Bathing: Total assistance;Sit to/from stand   Upper Body Dressing : Minimal assistance;Sitting   Lower Body Dressing: Total assistance;Sitting/lateral leans   Toilet Transfer: Moderate assistance Toilet Transfer Details (indicate cue type and  reason): stood to use urinal, pt with urinary incontinence         Functional mobility during ADLs: Moderate assistance General ADL Comments: pt awakened abruptly, stood at EOB and urinated on floor     Vision   Additional Comments: needs further assessment, did not consistently see items on L side of food tray, may be attention     Perception     Praxis      Pertinent Vitals/Pain Pain Assessment: Faces Faces Pain Scale: No hurt     Hand Dominance Right   Extremity/Trunk Assessment Upper Extremity Assessment Upper Extremity Assessment: Overall WFL for tasks assessed   Lower Extremity Assessment Lower Extremity Assessment: Defer to PT evaluation   Cervical / Trunk Assessment Cervical / Trunk Assessment: Normal   Communication Communication Communication: No difficulties (minimally conversant)   Cognition Arousal/Alertness: Awake/alert Behavior During Therapy: Impulsive Overall Cognitive Status: Impaired/Different from baseline Area of Impairment: Orientation;Attention;Memory;Following commands;Safety/judgement                 Orientation Level: Disoriented to;Place;Time;Situation (knew he was in [place]) Current Attention Level: Sustained Memory: Decreased short-term memory Following Commands: Follows one step commands inconsistently;Follows one step commands with increased time Safety/Judgement: Decreased awareness of safety;Decreased awareness of deficits         General Comments       Exercises     Shoulder Instructions      Home Living Family/patient expects to be discharged to:: Unsure  Prior Functioning/Environment Level of Independence: Independent                 OT Problem List: Decreased activity tolerance;Impaired balance (sitting and/or standing);Decreased cognition;Decreased safety awareness;Decreased knowledge of use of DME or AE;Decreased coordination      OT  Treatment/Interventions: Self-care/ADL training;DME and/or AE instruction;Patient/family education;Balance training;Cognitive remediation/compensation;Therapeutic activities    OT Goals(Current goals can be found in the care plan section) Acute Rehab OT Goals Patient Stated Goal: to eat OT Goal Formulation: Patient unable to participate in goal setting Time For Goal Achievement: 11/07/16 Potential to Achieve Goals: Good ADL Goals Pt Will Perform Grooming: with supervision;standing Pt Will Perform Upper Body Dressing: with supervision;sitting Pt Will Perform Lower Body Dressing: with supervision;sit to/from stand Pt Will Transfer to Toilet: with supervision;ambulating Pt Will Perform Toileting - Clothing Manipulation and hygiene: with supervision;sit to/from stand Additional ADL Goal #1: Pt will follow one step commands consistently.  OT Frequency: Min 2X/week   Barriers to D/C: Inaccessible home environment;Decreased caregiver support          Co-evaluation              AM-PAC PT "6 Clicks" Daily Activity     Outcome Measure Help from another person eating meals?: A Little Help from another person taking care of personal grooming?: A Lot Help from another person toileting, which includes using toliet, bedpan, or urinal?: Total Help from another person bathing (including washing, rinsing, drying)?: Total Help from another person to put on and taking off regular upper body clothing?: A Little Help from another person to put on and taking off regular lower body clothing?: Total 6 Click Score: 11   End of Session    Activity Tolerance: Patient tolerated treatment well Patient left: in chair;with call bell/phone within reach;with nursing/sitter in room  OT Visit Diagnosis: Unsteadiness on feet (R26.81);History of falling (Z91.81);Other symptoms and signs involving cognitive function;Muscle weakness (generalized) (M62.81);Other abnormalities of gait and mobility (R26.89)                 Time: [id number] OT Time Calculation (min): 36 min Charges:  OT General Charges $OT Visit: 1 Procedure OT Treatments $Self Care/Home Management : 8-22 mins G-Codes:       Evern BioMayberry, Kaaren Nass Lynn 10/24/2016, 4:40 PM  [phone]

## 2016-10-25 DIAGNOSIS — E878 Other disorders of electrolyte and fluid balance, not elsewhere classified: Secondary | ICD-10-CM | POA: Diagnosis present

## 2016-10-25 LAB — BASIC METABOLIC PANEL
Anion gap: 6 (ref 5–15)
BUN: 15 mg/dL (ref 6–20)
CALCIUM: 8.3 mg/dL — AB (ref 8.9–10.3)
CHLORIDE: 104 mmol/L (ref 101–111)
CO2: 24 mmol/L (ref 22–32)
CREATININE: 0.65 mg/dL (ref 0.61–1.24)
Glucose, Bld: 273 mg/dL — ABNORMAL HIGH (ref 65–99)
Potassium: 4.3 mmol/L (ref 3.5–5.1)
Sodium: 134 mmol/L — ABNORMAL LOW (ref 135–145)

## 2016-10-25 LAB — GLUCOSE, CAPILLARY
GLUCOSE-CAPILLARY: 275 mg/dL — AB (ref 65–99)
Glucose-Capillary: 306 mg/dL — ABNORMAL HIGH (ref 65–99)
Glucose-Capillary: 330 mg/dL — ABNORMAL HIGH (ref 65–99)
Glucose-Capillary: 258 mg/dL — ABNORMAL HIGH (ref 65–99)

## 2016-10-25 LAB — HCV RNA QUANT RFLX ULTRA OR GENOTYP
HCV RNA Qnt(log copy/mL): 5.43 log10 IU/mL
HepC Qn: 269000 IU/mL

## 2016-10-25 LAB — PHOSPHORUS: PHOSPHORUS: 1.9 mg/dL — AB (ref 2.5–4.6)

## 2016-10-25 LAB — HEPATITIS C GENOTYPE

## 2016-10-25 LAB — MAGNESIUM: MAGNESIUM: 1.6 mg/dL — AB (ref 1.7–2.4)

## 2016-10-25 MED ORDER — INSULIN GLARGINE 100 UNIT/ML ~~LOC~~ SOLN
15.0000 [IU] | Freq: Every day | SUBCUTANEOUS | Status: DC
  Administered 2016-10-25: 15 [IU] via SUBCUTANEOUS
  Filled 2016-10-25: qty 0.15

## 2016-10-25 MED ORDER — MAGNESIUM SULFATE 2 GM/50ML IV SOLN
2.0000 g | Freq: Once | INTRAVENOUS | Status: DC
  Filled 2016-10-25: qty 50

## 2016-10-25 MED ORDER — INSULIN GLARGINE 100 UNIT/ML ~~LOC~~ SOLN
5.0000 [IU] | Freq: Once | SUBCUTANEOUS | Status: AC
  Administered 2016-10-25: 5 [IU] via SUBCUTANEOUS
  Filled 2016-10-25: qty 0.05

## 2016-10-25 MED ORDER — K PHOS MONO-SOD PHOS DI & MONO 155-852-130 MG PO TABS
250.0000 mg | ORAL_TABLET | Freq: Two times a day (BID) | ORAL | Status: AC
  Administered 2016-10-25 (×2): 250 mg via ORAL
  Filled 2016-10-25 (×2): qty 1

## 2016-10-25 MED ORDER — SODIUM PHOSPHATES 45 MMOLE/15ML IV SOLN
40.0000 mmol | Freq: Once | INTRAVENOUS | Status: AC
  Administered 2016-10-25: 40 mmol via INTRAVENOUS
  Filled 2016-10-25: qty 13.33

## 2016-10-25 MED ORDER — MAGNESIUM SULFATE 4 GM/100ML IV SOLN
4.0000 g | Freq: Once | INTRAVENOUS | Status: AC
  Administered 2016-10-25: 4 g via INTRAVENOUS
  Filled 2016-10-25: qty 100

## 2016-10-25 MED ORDER — LOPERAMIDE HCL 2 MG PO CAPS
2.0000 mg | ORAL_CAPSULE | ORAL | Status: DC | PRN
  Administered 2016-10-25: 2 mg via ORAL
  Filled 2016-10-25: qty 1

## 2016-10-25 MED ORDER — INSULIN ASPART 100 UNIT/ML ~~LOC~~ SOLN
13.0000 [IU] | Freq: Three times a day (TID) | SUBCUTANEOUS | Status: DC
  Administered 2016-10-25 – 2016-10-27 (×5): 13 [IU] via SUBCUTANEOUS

## 2016-10-25 MED ORDER — PANTOPRAZOLE SODIUM 40 MG PO TBEC
40.0000 mg | DELAYED_RELEASE_TABLET | Freq: Every day | ORAL | Status: DC
  Administered 2016-10-25 – 2016-10-27 (×3): 40 mg via ORAL
  Filled 2016-10-25 (×3): qty 1

## 2016-10-25 NOTE — Progress Notes (Signed)
Occupational Therapy Treatment Patient Details Name: Sean Walsh MRN: [id number] DOB: 07-24-1955 Today's Date: 10/25/2016    History of present illness Pt admitted with acute encephalopathy, rhabdomyolosis, facial fx due to fall and hyponatremia. PMH: substance abuse   OT comments  Pt continues to be disoriented to place, time and situation. Impulsively attempting to go the the bathroom without regard to deficits and safety. Requiring moderate assistance to get to/from [facility] and total assist for pericare. Min assist to change soiled gown. SNF continues to be the optimal d/c disposition, pt reports that he has no care at home, his friends "are all drunks."  Follow Up Recommendations  SNF;Supervision/Assistance - 24 hour    Equipment Recommendations       Recommendations for Other Services      Precautions / Restrictions Precautions Precautions: Fall Restrictions Weight Bearing Restrictions: No       Mobility Bed Mobility Overal bed mobility: Needs Assistance Bed Mobility: Supine to Sit;Sit to Supine     Supine to sit: Supervision Sit to supine: Supervision   General bed mobility comments: supervision for safety/IV line  Transfers Overall transfer level: Needs assistance Equipment used: None Transfers: Sit to/from Stand Sit to Stand: Min guard         General transfer comment: pt standing impulsively, very unsteady    Balance Overall balance assessment: Needs assistance   Sitting balance-Leahy Scale: Fair       Standing balance-Leahy Scale: Poor                             ADL either performed or assessed with clinical judgement   ADL Overall ADL's : Needs assistance/impaired     Grooming: Wash/dry hands;Sitting;Moderate assistance           Upper Body Dressing : Minimal assistance;Sitting       Toilet Transfer: Moderate assistance;Ambulation;BSC   Toileting- Clothing Manipulation and Hygiene: Total assistance;Sit to/from stand        Functional mobility during ADLs: Moderate assistance General ADL Comments: pt attempting to get to bathroom upon OT's entry     Vision       Perception     Praxis      Cognition Arousal/Alertness: Awake/alert Behavior During Therapy: Impulsive Overall Cognitive Status: Impaired/Different from baseline Area of Impairment: Orientation;Attention;Memory;Following commands;Safety/judgement                 Orientation Level: Disoriented to;Place;Time;Situation Current Attention Level: Sustained Memory: Decreased short-term memory Following Commands: Follows one step commands inconsistently;Follows one step commands with increased time Safety/Judgement: Decreased awareness of safety;Decreased awareness of deficits              Exercises     Shoulder Instructions       General Comments      Pertinent Vitals/ Pain       Pain Assessment: No/denies pain  Home Living                                          Prior Functioning/Environment              Frequency  Min 2X/week        Progress Toward Goals  OT Goals(current goals can now be found in the care plan section)  Progress towards OT goals: Not progressing toward goals - comment (continues to demonstrate impaired cognition)  Acute Rehab OT Goals Patient Stated Goal: to use the bathroom OT Goal Formulation: Patient unable to participate in goal setting Time For Goal Achievement: 11/07/16 Potential to Achieve Goals: Good  Plan Discharge plan remains appropriate    Co-evaluation                 AM-PAC PT "6 Clicks" Daily Activity     Outcome Measure   Help from another person eating meals?: A Little Help from another person taking care of personal grooming?: A Lot Help from another person toileting, which includes using toliet, bedpan, or urinal?: Total Help from another person bathing (including washing, rinsing, drying)?: Total Help from another person to put  on and taking off regular upper body clothing?: A Little Help from another person to put on and taking off regular lower body clothing?: Total 6 Click Score: 11    End of Session    OT Visit Diagnosis: Unsteadiness on feet (R26.81);History of falling (Z91.81);Other symptoms and signs involving cognitive function;Muscle weakness (generalized) (M62.81);Other abnormalities of gait and mobility (R26.89)   Activity Tolerance Patient tolerated treatment well   Patient Left in bed;with call bell/phone within reach;with bed alarm set   Nurse Communication          Time: [id number] OT Time Calculation (min): 16 min  Charges: OT General Charges $OT Visit: 1 Procedure OT Treatments $Self Care/Home Management : 8-22 mins   Evern BioMayberry, Nykia Turko Lynn 10/25/2016, 3:47 PM  [phone]

## 2016-10-25 NOTE — Progress Notes (Signed)
   Subjective: Mr. Gerrit HallsKelsor is awake and conversational on rounds this morning. He describes some epigastric abdominal pain which is worse with eating and does not radiate. He had multiple loose bowel movements this morning. He says that he is still hungry.   Objective:  Vital signs in last 24 hours: Vitals:   10/25/16 0004 10/25/16 0649 10/25/16 0849 10/25/16 1445  BP: (!) 118/59 (!) 144/69 (!) 150/77 117/60  Pulse: 93 73 83 86  Resp:  16  16  Temp: 99 F (37.2 C) 97.5 F (36.4 C)  99.3 F (37.4 C)  TempSrc: Oral Oral  Oral  SpO2: 100% 100%    Weight:      Height:       Physical Exam  Constitutional: He is well-developed, well-nourished, and in no distress. No distress.  HENT:  Head: Normocephalic.  Multiple facial abrasions   Eyes: Conjunctivae are normal. Right eye exhibits no discharge. Left eye exhibits no discharge. No scleral icterus.  No diplopia   Cardiovascular: Normal rate and regular rhythm.   No murmur heard. Pulmonary/Chest: Effort normal and breath sounds normal. No respiratory distress.  Abdominal: Soft. Bowel sounds are normal. He exhibits no distension. There is no tenderness. There is no guarding.  Neurological: He is alert.  Skin: Skin is warm and dry. He is not diaphoretic.  Multiple facial, shoulder, and knee abrasions    Assessment/Plan:  Principal Problem:   Acute encephalopathy Active Problems:   Rhabdomyolysis   Facial fracture due to fall (HCC)   Hypokalemia   Substance abuse   Diabetes (HCC)   HTN (hypertension)   HCV antibody positive   Positive RPR test   Refeeding syndrome  Acute encephalopathy  Resolving, Mr. Gerrit HallsKelsor is more alert and awake on exam today. His confusion has improved significantly and he is no longer needing a sitter. PT and OT have recommended SNF placement, he had significant difficulty with moving around himself. We will continue treating with thiamine for possible wernickes encephalopathy.  - avoid sedating  medications - deescalate thiamine to once daily dosing for the next 4 days then transition to oral after that  -delirium precautions   Refeeding syndrome  Hypomagnesemia  Hypophosphatemia  Hyponatremia  Classic presentation, his BMI is 16 and he reports weeks of poor access to food with known history of alcohol use. Since he became more alert yesterday his glucose has been elevated, K 4.3 this morning, Mag 1.6 and phosphorous undetectable (<1).  - Repleating mag 4 mg  - Repleating sodium phosphate 40 mmol IV infusion  - Follow up morning labs   Rhabdomyolysis  Resolved. Creatinine remains WNL.  - daily bmp   Facial fractures  - continue conservative management at discharge   Lactic acidosis Resolved   Substance abuse  Known hx of alcohol and cocaine use. He is not acutely intoxicated or in withdrawal from either of these.  -IV thiamine and folic acid, MVM  - avoid sedating medications   Hypertension  BP is well controlled.  -continue amlodipine 5 mg daily  Diabetes  CBG 300s since he has began eating.  - continue ISS with HS coverage  - start lantus 5 units qHS  - increased novolog to 8 units with meals   Dispo: Anticipated discharge in approximately 1-2 day(s).   Eulah PontBlum, Safiyyah Vasconez, MD 10/25/2016, 2:48 PM Pager: [phone]

## 2016-10-25 NOTE — NC FL2 (Signed)
[place] MEDICAID FL2 LEVEL OF CARE SCREENING TOOL     IDENTIFICATION  Patient Name: Sean Walsh Birthdate: 12/19/1955 Sex: male Admission Date (Current Location): 10/18/2016  [facility] and [place] Number:  Producer, television/film/video and Address:  [facility] [place], [address], [place], [place] [address]      Provider Number: [id number]  Attending Physician Name and Address:  Gust Rung, DO  Relative Name and Phone Number:  Doristine Counter, mother, [phone]    Current Level of Care: Hospital Recommended Level of Care: Skilled Nursing Facility Prior Approval Number:    Date Approved/Denied:   PASRR Number: [id number]  Discharge Plan: SNF    Current Diagnoses: Patient Active Problem List   Diagnosis Date Noted  . HTN (hypertension) 10/21/2016  . 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 Acute encephalopathy 10/19/2016  . Rhabdomyolysis 10/18/2016  . Facial fracture due to fall (HCC) 10/18/2016  . Hypokalemia 10/18/2016  . Substance abuse 10/18/2016  . Diabetes (HCC) 10/18/2016    Orientation RESPIRATION BLADDER Height & Weight     Self  Normal Continent Weight: 45.4 kg (100 lb) Height:  5\' 5"  (165.1 cm)  BEHAVIORAL SYMPTOMS/MOOD NEUROLOGICAL BOWEL NUTRITION STATUS      Continent Diet (Please see DC Summary)  AMBULATORY STATUS COMMUNICATION OF NEEDS Skin   Limited Assist Verbally Normal                       Personal Care Assistance Level of Assistance  Bathing, Feeding, Dressing Bathing Assistance: Maximum assistance Feeding assistance: Independent Dressing Assistance: Limited assistance     Functional Limitations Info             SPECIAL CARE FACTORS FREQUENCY  PT (By licensed PT)     PT Frequency: 5x/week              Contractures      Additional Factors Info  Code Status, Allergies, Insulin Sliding Scale Code Status Info: Full Allergies Info: NKA   Insulin Sliding Scale Info: 3x  daily with meals and at bedtime       Current Medications (10/25/2016):  This is the current hospital active medication list Current Facility-Administered Medications  Medication Dose Route Frequency Provider Last Rate Last Dose  . acetaminophen (TYLENOL) tablet 650 mg  650 mg Oral Q6H PRN Gwynn Burly, DO   650 mg at 10/23/16 1510   Or  . acetaminophen (TYLENOL) suppository 650 mg  650 mg Rectal Q6H PRN Gwynn Burly, DO      . amLODipine (NORVASC) tablet 5 mg  5 mg Oral Daily Gwynn Burly, DO   5 mg at 10/25/16 0854  . bacitracin ointment   Topical BID Gust Rung, DO      . enoxaparin (LOVENOX) injection 30 mg  30 mg Subcutaneous Q24H Hammons, Kimberly B, RPH   30 mg at 10/24/16 1854  . feeding supplement (GLUCERNA SHAKE) (GLUCERNA SHAKE) liquid 237 mL  237 mL Oral TID BM Hoffman, Erik C, DO   237 mL at 10/25/16 1000  . folic acid injection 1 mg  1 mg Intravenous Daily Gwynn Burly, DO   1 mg at 10/25/16 0906  . insulin aspart (novoLOG) injection 0-15 Units  0-15 Units Subcutaneous TID WC Eulah Pont, MD   11 Units at 10/25/16 [phone]  . insulin aspart (novoLOG) injection 0-5 Units  0-5 Units Subcutaneous QHS Eulah Pont, MD   4 Units at 10/24/16 2113  .  insulin aspart (novoLOG) injection 8 Units  8 Units Subcutaneous TID WC Wallace, Andrew, DO   8 Units at 10/25/16 [phone]  . insulin glargine (LANTUS) injection 10 Units  10 Units Subcutaneous QHS Eulah PontBlum, Nina, MD   10 Units at 10/24/16 1544  . loperamGwynn Burlyide (IMODIUM) capsule 2 mg  2 mg Oral PRN Althia FortsJohnson, Adam, MD   2 mg at 10/25/16 0119  . magnesium sulfate IVPB 4 g 100 mL  4 g Intravenous Once Eulah PontBlum, Nina, MD      . multivitamin with minerals tablet 1 tablet  1 tablet Oral Daily Gwynn BurlyWallace, Andrew, DO   1 tablet at 10/25/16 0854  . pantoprazole (PROTONIX) EC tablet 40 mg  40 mg Oral Daily Gwynn BurlyWallace, Andrew, DO      . phosphorus (K PHOS NEUTRAL) tablet 250 mg  250 mg Oral BID Eulah PontBlum, Nina, MD      . polyethylene glycol (MIRALAX / GLYCOLAX) packet  17 g  17 g Oral Daily PRN Gwynn BurlyWallace, Andrew, DO      . sodium chloride (OCEAN) 0.65 % nasal spray 1 spray  1 spray Each Nare PRN Eulah PontBlum, Nina, MD   1 spray at 10/23/16 1737  . sodium phosphate 40 mmol in dextrose 5 % 250 mL infusion  40 mmol Intravenous Once Eulah PontBlum, Nina, MD 44 mL/hr at 10/25/16 1126 40 mmol at 10/25/16 1126  . thiamine (B-1) injection 250 mg  250 mg Intravenous Daily Eulah PontBlum, Nina, MD   250 mg at 10/25/16 [phone]  . [START ON 10/29/2016] thiamine (VITAMIN B-1) tablet 100 mg  100 mg Oral TID Eulah PontBlum, Nina, MD         Discharge Medications: Please see discharge summary for a list of discharge medications.  Relevant Imaging Results:  Relevant Lab Results:   Additional Information SSN: [id number]  Verity Gil[address]st S Palm Shores[place], [place]WA

## 2016-10-25 NOTE — Progress Notes (Signed)
CRITICAL VALUE ALERT  Critical Value:  Phosphorus less than1.0  Date & Time Notied: 10/25/16 1028 am Provider Notified:Dr. Obie DredgeBlum paged at 1031 am  Orders Received/Actions taken:

## 2016-10-25 NOTE — Progress Notes (Signed)
CSW left voicemail for patient's [facility] CSW, Judie GrieveBryan, to discuss SNF discharge options.  Osborne Cascoadia Braylon Grenda LCSWA [phone]

## 2016-10-26 LAB — GLUCOSE, CAPILLARY
GLUCOSE-CAPILLARY: 184 mg/dL — AB (ref 65–99)
GLUCOSE-CAPILLARY: 244 mg/dL — AB (ref 65–99)
GLUCOSE-CAPILLARY: 121 mg/dL — AB (ref 65–99)
Glucose-Capillary: 177 mg/dL — ABNORMAL HIGH (ref 65–99)
Glucose-Capillary: 289 mg/dL — ABNORMAL HIGH (ref 65–99)
Glucose-Capillary: 359 mg/dL — ABNORMAL HIGH (ref 65–99)

## 2016-10-26 LAB — BASIC METABOLIC PANEL
Anion gap: 7 (ref 5–15)
BUN: 12 mg/dL (ref 6–20)
CHLORIDE: 103 mmol/L (ref 101–111)
CO2: 25 mmol/L (ref 22–32)
Calcium: 8.3 mg/dL — ABNORMAL LOW (ref 8.9–10.3)
Creatinine, Ser: 0.63 mg/dL (ref 0.61–1.24)
GFR calc Af Amer: >60 mL/min (ref >60)
GFR calc non Af Amer: >60 mL/min (ref >60)
Glucose, Bld: 245 mg/dL — ABNORMAL HIGH (ref 65–99)
POTASSIUM: 3.9 mmol/L (ref 3.5–5.1)
SODIUM: 135 mmol/L (ref 135–145)

## 2016-10-26 LAB — MAGNESIUM: Magnesium: 1.9 mg/dL (ref 1.7–2.4)

## 2016-10-26 LAB — PHOSPHORUS: PHOSPHORUS: 1.4 mg/dL — AB (ref 2.5–4.6)

## 2016-10-26 MED ORDER — AMLODIPINE BESYLATE 5 MG PO TABS: 5.0000 mg | ORAL_TABLET | Freq: Every day | ORAL | 0 refills | Status: AC

## 2016-10-26 MED ORDER — INSULIN ASPART 100 UNIT/ML ~~LOC~~ SOLN
10.0000 [IU] | Freq: Once | SUBCUTANEOUS | Status: AC
  Administered 2016-10-26: 10 [IU] via SUBCUTANEOUS

## 2016-10-26 MED ORDER — THIAMINE HCL 100 MG PO TABS: 100.0000 mg | ORAL_TABLET | Freq: Three times a day (TID) | ORAL | 0 refills | Status: AC

## 2016-10-26 MED ORDER — THIAMINE HCL 100 MG PO TABS: 100.0000 mg | ORAL_TABLET | Freq: Every day | ORAL | 3 refills | Status: AC

## 2016-10-26 MED ORDER — ADULT MULTIVITAMIN W/MINERALS CH: 1.0000 | ORAL_TABLET | Freq: Every day | ORAL | 0 refills | Status: AC

## 2016-10-26 MED ORDER — INSULIN ASPART 100 UNIT/ML ~~LOC~~ SOLN: 13.0000 [IU] | Freq: Three times a day (TID) | SUBCUTANEOUS | 11 refills | Status: AC

## 2016-10-26 MED ORDER — BACITRACIN ZINC 500 UNIT/GM EX OINT: TOPICAL_OINTMENT | Freq: Two times a day (BID) | CUTANEOUS | 0 refills | Status: AC

## 2016-10-26 MED ORDER — K PHOS MONO-SOD PHOS DI & MONO 155-852-130 MG PO TABS: 500.0000 mg | ORAL_TABLET | Freq: Two times a day (BID) | ORAL | 0 refills | Status: AC

## 2016-10-26 MED ORDER — PANTOPRAZOLE SODIUM 40 MG PO TBEC: 40.0000 mg | DELAYED_RELEASE_TABLET | Freq: Every day | ORAL | 0 refills | Status: AC

## 2016-10-26 MED ORDER — INSULIN ASPART 100 UNIT/ML ~~LOC~~ SOLN: 0.0000 [IU] | Freq: Every day | SUBCUTANEOUS | Status: DC

## 2016-10-26 MED ORDER — K PHOS MONO-SOD PHOS DI & MONO 155-852-130 MG PO TABS
250.0000 mg | ORAL_TABLET | Freq: Two times a day (BID) | ORAL | Status: DC
  Filled 2016-10-26: qty 1

## 2016-10-26 MED ORDER — INSULIN GLARGINE 100 UNIT/ML ~~LOC~~ SOLN
5.0000 [IU] | Freq: Once | SUBCUTANEOUS | Status: AC
  Administered 2016-10-26: 5 [IU] via SUBCUTANEOUS
  Filled 2016-10-26: qty 0.05

## 2016-10-26 MED ORDER — INSULIN GLARGINE 100 UNIT/ML ~~LOC~~ SOLN: 20.0000 [IU] | Freq: Every day | SUBCUTANEOUS | 11 refills | Status: AC

## 2016-10-26 MED ORDER — K PHOS MONO-SOD PHOS DI & MONO 155-852-130 MG PO TABS
500.0000 mg | ORAL_TABLET | Freq: Two times a day (BID) | ORAL | Status: AC
  Administered 2016-10-26 (×2): 500 mg via ORAL
  Filled 2016-10-26 (×2): qty 2

## 2016-10-26 MED ORDER — INSULIN GLARGINE 100 UNIT/ML ~~LOC~~ SOLN
20.0000 [IU] | Freq: Every day | SUBCUTANEOUS | Status: DC
  Administered 2016-10-26: 20 [IU] via SUBCUTANEOUS
  Filled 2016-10-26 (×2): qty 0.2

## 2016-10-26 MED ORDER — FOLIC ACID 1 MG PO TABS: 1.0000 mg | ORAL_TABLET | Freq: Every day | ORAL | 0 refills | Status: AC

## 2016-10-26 MED ORDER — INSULIN ASPART 100 UNIT/ML ~~LOC~~ SOLN
0.0000 [IU] | Freq: Three times a day (TID) | SUBCUTANEOUS | Status: DC
  Administered 2016-10-26: 3 [IU] via SUBCUTANEOUS
  Administered 2016-10-27: 1 [IU] via SUBCUTANEOUS

## 2016-10-26 MED ORDER — THIAMINE HCL 100 MG/ML IJ SOLN: 250.0000 mg | Freq: Every day | INTRAMUSCULAR | 0 refills | Status: AC

## 2016-10-26 MED ORDER — SALINE SPRAY 0.65 % NA SOLN: 1.0000 | NASAL | 0 refills | Status: AC | PRN

## 2016-10-26 NOTE — Progress Notes (Signed)
PT Cancellation Note  Patient Details Name: Sean Walsh MRN: [id number] DOB: 01-05-1956   Cancelled Treatment:    Reason Eval/Treat Not Completed: Patient declined, no reason specified. Pt reports he is still hungry and is requesting PT check back later. Will check back as time allows.    Colin BroachSabra M. Jarian Longoria PT, DPT  [phone]  10/26/2016, 12:56 PM

## 2016-10-26 NOTE — Progress Notes (Signed)
5:30pm: [facility] texted CSW that their business office is not comfortable taking patient because it's impossible to "change insurances during the year". CSW staffed case with CSW ChiropodistAssistant Director. He stated that if patient has Medicare, CSW should ask another SNF to look at the case. CSW will contact [facility] to review for discharge tomorrow.   4:30pm: [facility] reported that they are unable to find [facility] system. CSW called [facility], Adam. He looked up the patient in the Medicare system and verified that Medicare A and B are active. Patient's mother also provided CSW with the Medicare card number. CSW alerted [facility].   4pm: CSW received call from [facility]. They stated that patient was showing now showing [facility] [place] which requires pre-authorization. They will contact CSW back to notify about auth.  Osborne Cascoadia Seaton Hofmann LCSWA [phone]

## 2016-10-26 NOTE — Care Management Important Message (Signed)
Important Message  Patient Details  Name: Sean Walsh MRN: [id number] Date of Birth: Sep 29, 1955   Medicare Important Message Given:  Yes    Elliot CousinShavis, Jahseh Lucchese Ellen, RN 10/26/2016, 4:51 PM

## 2016-10-26 NOTE — Progress Notes (Signed)
Pts blood sugar 184 RN called MD asking if they wanted to hold the extra 13 Units Dr Obie DredgeBlum said to hold 13 units of insulin for now and give 3 Units as directed by sliding scale
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
## 2016-10-26 NOTE — Progress Notes (Signed)
   Subjective:  He describes improvement in his abdominal pain and says that the only thing that is bothering him is that he feels hungry. He denies any new concerns or complaints.    Objective:  Vital signs in last 24 hours: Vitals:   10/25/16 1445 10/25/16 2127 10/26/16 0509 10/26/16 0840  BP: 117/60 123/76 134/72 (!) 138/92  Pulse: 86 82 80   Resp: 16 18 18    Temp: 99.3 F (37.4 C) 98.8 F (37.1 C) 98.5 F (36.9 C)   TempSrc: Oral Oral Oral   SpO2:  100% 100%   Weight:      Height:       Physical Exam  Constitutional: He is well-developed, well-nourished, and in no distress. No distress.  HENT:  Head: Normocephalic.  Multiple facial abrasions   Eyes: Conjunctivae are normal. Right eye exhibits no discharge. Left eye exhibits no discharge. No scleral icterus.  Cardiovascular: Normal rate and regular rhythm.   No murmur heard. Pulmonary/Chest: Effort normal and breath sounds normal. No respiratory distress.  Abdominal: Soft. Bowel sounds are normal. He exhibits no distension. There is no tenderness. There is no guarding.  Neurological: He is alert.  Skin: Skin is warm and dry. He is not diaphoretic.  Multiple facial, shoulder, and knee abrasions    Assessment/Plan:  Principal Problem:   Acute encephalopathy Active Problems:   Rhabdomyolysis   Facial fracture due to fall (HCC)   Hypokalemia   Substance abuse   Diabetes (HCC)   HTN (hypertension)   HCV antibody positive   Positive RPR test   Refeeding syndrome  Acute encephalopathy  Resolved. We will continue treating with thiamine for possible wernickes encephalopathy at discharge.  - will continue to avoid sedating medications - deescalate thiamine to once daily dosing for the next 3 days then transition to oral after that  - continue delirium precautions   Refeeding syndrome  Hypomagnesemia  Hypophosphatemia  Hyponatremia  Glucose remains elevated, K is 3.9 this morning, phos 1.4, and Magnesium 1.9. These  have responded appropriately to repletion.  - Repleating pho K phos 500 mg x2  - Follow up morning labs   Rhabdomyolysis  Resolved. Creatinine remains WNL.  - daily bmp   Facial fractures  - continue conservative management at discharge   Substance abuse  - Continue IV thiamine and folic acid, MVM   Hypertension  BP is well controlled.  -continue amlodipine 5 mg daily  Diabetes  Fasting blood glucose 245 this morning, and improving from 300s throughout the day yesterday.  - continue ISS with HS coverage  - increase lantus to 20 units qHS (giving 5 units this morning)  - increased novolog to 13 units with meals with additional ISS coverage, he is getting about 15-20 units at meals   Dispo: Anticipated discharge pending SNF placement   Sean Walsh, Sean Mctavish, MD 10/26/2016, 11:04 AM Pager: [phone]

## 2016-10-26 NOTE — Discharge Instructions (Signed)
[name],   It was a pleasure working with you, I hope you continue to feel better.   When you fell you broke multiple bones in your face, some of the broken bones surround your eyes, please follow these conservative instructions and see an eye doctor immediately if you develop changes in your vision.   Your blood glucose and blood pressure were very elevated this admission, you will need to start some medications for this when you follow up with your primary care doctor.   Ask your doctor about treatment options for hepatitis C   Continue to take thiamine supplementations indefinitely, these may protect you from developing confusion in the long run.   Please call our clinic if you have any questions or concerns about this hospitalization [phone]

## 2016-10-26 NOTE — Progress Notes (Signed)
PT Cancellation Note  Patient Details Name: Sean Walsh MRN: [id number] DOB: 1955/08/17   Cancelled Treatment:    Reason Eval/Treat Not Completed: Patient declined, no reason specified. Pt reports that all he wants is to eat. Reports that he doesn't want to get up or move and wants some cobbler to eat. Will not even get up if promised to give something to eat when he does get up. Pt will check back as time allows. Pt supposed to be transferring to SNF.    Colin BroachSabra M. Hayven Fatima PT, DPT  [phone]  10/26/2016, 3:25 PM

## 2016-10-26 NOTE — Progress Notes (Signed)
Notified Dr. Laural BenesJohnson that pt has been eating peanut butter and graham crackers all night. Concerned that pt's blood sugar is going to be high this morning. Pt will not stay in bed unless we give him snacks. MD stated to give pt whatever he wants to eat and they will just give him more insulin this morning. Will continue to monitor pt. Nelda MarseilleJenny Thacker, RN

## 2016-10-27 LAB — GLUCOSE, CAPILLARY
GLUCOSE-CAPILLARY: 119 mg/dL — AB (ref 65–99)
Glucose-Capillary: 98 mg/dL (ref 65–99)
Glucose-Capillary: 145 mg/dL — ABNORMAL HIGH (ref 65–99)

## 2016-10-27 NOTE — Progress Notes (Signed)
Pt prepared for d/c to SNF. IV d/c'd. Skin intact except as charted in most recent assessments. Vitals are stable. Report called to receiving facility. Pt to be transported by ambulance service. 

## 2016-10-27 NOTE — Clinical Social Work Note (Signed)
Clinical Social Work Assessment  Patient Details  Name: Sean Walsh MRN: [id number] Date of Birth: 11/24/1955  Date of referral:  10/23/16               Reason for consult:  Facility Placement                Permission sought to share information with:  Facility Medical sales representativeContact Representative, Family Supports Permission granted to share information::  No  Name::     Sean Walsh  Agency::  SNFs  Relationship::  Mother  Contact Information:  [phone]  Housing/Transportation Living arrangements for the past 2 months:   ([facility] [employer]) Source of Information:  Parent, Other (Comment Required) ([facility] CSW) Patient Interpreter Needed:  None Criminal Activity/Legal Involvement Pertinent to Current Situation/Hospitalization:  No - Comment as needed Significant Relationships:  Parents Lives with:  Self Do you feel safe going back to the place where you live?  No Need for family participation in patient care:  Yes (Comment)  Care giving concerns:  CSW received consult for possible SNF placement at time of discharge. Patient is disoriented. CSW spoke with patient's mother and [facility] Child psychotherapistsocial worker regarding PT recommendation of SNF placement at time of discharge. Patient's mother [place] and is unable to care for patient and that he "made the choice to go to [place]" and is often times homeless. Patient's mother expressed understanding of PT recommendation and is agreeable to SNF placement at time of discharge. CSW to continue to follow and assist with discharge planning needs.   Social Worker assessment / plan:  CSW spoke with patient's mother and [facility] Child psychotherapistocial Worker with [employer] Sean Walsh(Sean Walsh [phone]) concerning possibility of rehab at SNF before returning home.  Employment status:  Unemployed Health and safety inspectornsurance information:  Medicare PT Recommendations:  Skilled Nursing Facility Information / Referral to community resources:  Skilled Nursing Facility  Patient/Family's Response to  care:  Patient's mother recognizes need for rehab before returning home and is agreeable to a SNF. She appreciated CSW help with placement since she cannot be here.   Patient/Family's Understanding of and Emotional Response to Diagnosis, Current Treatment, and Prognosis:  Patient/family is realistic regarding therapy needs and expressed being hopeful for SNF placement. Patient's mother expressed understanding of CSW role and discharge process as well as his medical condition. No questions/concerns about plan or treatment.    Emotional Assessment Appearance:  Appears stated age Attitude/Demeanor/Rapport:  Unable to Assess Affect (typically observed):  Unable to Assess Orientation:  Oriented to Self, Oriented to Place Alcohol / Substance use:  Alcohol Use Psych involvement (Current and /or in the community):  No (Comment)  Discharge Needs  Concerns to be addressed:  Care Coordination Readmission within the last 30 days:  No Current discharge risk:  None Barriers to Discharge:  No Barriers Identified   Mearl Latinadia S Aisley Whan, LCSWA 10/27/2016, 5:09 PM

## 2016-10-27 NOTE — Progress Notes (Signed)
   Subjective:  [name] denies any new concerns or complaints, he is sitting beside his bed with a completed breakfast tray and his only comment is that he is hungry. He is stable for discharge to SNF today.   Objective:  Vital signs in last 24 hours: Vitals:   10/26/16 0840 10/26/16 1350 10/26/16 2108 10/27/16 0746  BP: (!) 138/92 (!) 143/75 134/76 (!) 143/79  Pulse:  91 89 80  Resp:  18 18 18   Temp:  98.9 F (37.2 C) 98.6 F (37 C) 99.2 F (37.3 C)  TempSrc:  Oral    SpO2:  100% 100% 99%  Weight:      Height:       Physical Exam  Constitutional: He is well-developed, well-nourished, and in no distress. No distress.  HENT:  Head: Normocephalic.  Multiple facial abrasions   Eyes: Conjunctivae are normal. Right eye exhibits no discharge. Left eye exhibits no discharge. No scleral icterus.  Cardiovascular: Normal rate and regular rhythm.   No murmur heard. Pulmonary/Chest: Effort normal and breath sounds normal. No respiratory distress.  Abdominal: Soft. Bowel sounds are normal. He exhibits distension. There is no tenderness. There is no guarding.  Neurological: He is alert.  Skin: Skin is warm and dry. He is not diaphoretic.  Multiple facial, shoulder, and knee abrasions    Assessment/Plan:  Principal Problem:   Acute encephalopathy Active Problems:   Rhabdomyolysis   Facial fracture due to fall (HCC)   Hypokalemia   Substance abuse   Diabetes (HCC)   HTN (hypertension)   HCV antibody positive   Positive RPR test   Refeeding syndrome  Acute encephalopathy  Resolved. We will continue treating with thiamine for possible wernickes encephalopathy at discharge.  -continue to avoid sedating medications - deescalate thiamine to once daily dosing for the next 2 days then transition to oral after that  - continue delirium precautions   Refeeding syndrome  Hypomagnesemia  Hypophosphatemia  Hyponatremia  These have responded appropriately to repletion and will need  continued monitoring at discharge.   Rhabdomyolysis  Resolved. Creatinine remains WNL.   Facial fractures  - continue conservative management at discharge   Substance abuse  - Continue IM thiamine and folic acid, MVM   Hypertension  BP is well controlled.  -continue amlodipine 5 mg daily  Diabetes  Fasting blood glucose 245 this morning, and improving from 300s throughout the day yesterday.  - continue ISS with HS coverage  - increase lantus to 20 units qHS (giving 5 units this morning)  - increased novolog to 13 units with meals with additional ISS coverage, he is getting about 15-20 units at meals   Dispo: Anticipated discharge pending SNF placement   Eulah PontBlum, Windsor Zirkelbach, MD 10/27/2016, 3:07 PM Pager: [phone]

## 2016-10-27 NOTE — Progress Notes (Signed)
Patient is able to discharge to [name] today.  Osborne Cascoadia Venesha Petraitis LCSWA [phone]

## 2016-10-27 NOTE — Progress Notes (Signed)
Internal Medicine Attending:   I saw and examined the patient. I reviewed the resident's note and I agree with the resident's findings and plan as documented in the resident's note. No complaints, still eating a full breakfast and requesting to eat more.  Blood glucose better controlled.  Overall appears to be doing much better.  Stable for discharge to SNF today.

## 2016-10-27 NOTE — Clinical Social Work Placement (Signed)
   CLINICAL SOCIAL WORK PLACEMENT  NOTE  Date:  10/27/2016  Patient Details  Name: Sean Walsh MRN: [id number] Date of Birth: 1955/10/13  Clinical Social Work is seeking post-discharge placement for this patient at the Skilled  Nursing Facility level of care (*CSW will initial, date and re-position this form in  chart as items are completed):  Yes   Patient/family provided with [facility] Clinical Social Work Department's list of facilities offering this level of care within the geographic area requested by the patient (or if unable, by the patient's family).  Yes   Patient/family informed of their freedom to choose among providers that offer the needed level of care, that participate in Medicare, Medicaid or managed care program needed by the patient, have an available bed and are willing to accept the patient.  Yes   Patient/family informed of [facility]'s ownership interest in [facility] and [facility], as well as of the fact that they are under no obligation to receive care at these facilities.  PASRR submitted to EDS on 10/23/16     PASRR number received on 10/23/16     Existing PASRR number confirmed on       FL2 transmitted to all facilities in geographic area requested by pt/family on 10/23/16     FL2 transmitted to all facilities within larger geographic area on       Patient informed that his/her managed care company has contracts with or will negotiate with certain facilities, including the following:        Yes   Patient/family informed of bed offers received.  Patient chooses bed at [facility]     Physician recommends and patient chooses bed at      Patient to be transferred to [facility] on 10/27/16.  Patient to be transferred to facility by [facility]     Patient family notified on 10/27/16 of transfer.  Name of family member notified:  Mother     PHYSICIAN       Additional Comment:     _______________________________________________ Mearl LatinNadia S Delson Dulworth, LCSWA 10/27/2016, 3:21 PM

## 2016-10-27 NOTE — Progress Notes (Signed)
Patient will DC to: [facility] Anticipated DC date: 10/27/16 Family notified: Mother Transport by: Sh[facility] MonsPTAR   Per MD patient ready for DC to [name]. RN, patient, patient's family, and facility notified of DC. Discharge Summary sent to facility. RN given number for report [phone]). DC packet on chart. Ambulance transport requested for patient.   CSW signing off.  Cristobal GoldmannNadia Chaze Hruska, [place] Clinical Social Worker [phone]

## 2016-11-04 ENCOUNTER — Emergency Department: Payer: Medicare Other

## 2016-11-04 ENCOUNTER — Encounter: Payer: Self-pay | Admitting: Emergency Medicine

## 2016-11-04 ENCOUNTER — Emergency Department
Admission: EM | Admit: 2016-11-04 | Discharge: 2016-11-05 | Disposition: A | Discharge status: Home / Self Care | Payer: Medicare Other | Attending: Emergency Medicine | Admitting: Emergency Medicine

## 2016-11-04 DIAGNOSIS — F0391 Unspecified dementia with behavioral disturbance: Secondary | ICD-10-CM

## 2016-11-04 DIAGNOSIS — I10 Essential (primary) hypertension: Secondary | ICD-10-CM | POA: Insufficient documentation

## 2016-11-04 DIAGNOSIS — F17200 Nicotine dependence, unspecified, uncomplicated: Secondary | ICD-10-CM | POA: Insufficient documentation

## 2016-11-04 DIAGNOSIS — R410 Disorientation, unspecified: Secondary | ICD-10-CM | POA: Diagnosis not present

## 2016-11-04 DIAGNOSIS — E119 Type 2 diabetes mellitus without complications: Secondary | ICD-10-CM | POA: Insufficient documentation

## 2016-11-04 DIAGNOSIS — R4182 Altered mental status, unspecified: Secondary | ICD-10-CM | POA: Diagnosis present

## 2016-11-04 LAB — COMPREHENSIVE METABOLIC PANEL
ALT: 39 U/L (ref 17–63)
ANION GAP: 7 (ref 5–15)
AST: 26 U/L (ref 15–41)
Albumin: 2.9 g/dL — ABNORMAL LOW (ref 3.5–5.0)
Alkaline Phosphatase: 109 U/L (ref 38–126)
BUN: 21 mg/dL — AB (ref 6–20)
CALCIUM: 8.7 mg/dL — AB (ref 8.9–10.3)
CO2: 28 mmol/L (ref 22–32)
Chloride: 103 mmol/L (ref 101–111)
Creatinine, Ser: 0.82 mg/dL (ref 0.61–1.24)
GFR calc Af Amer: >60 mL/min (ref >60)
Glucose, Bld: 231 mg/dL — ABNORMAL HIGH (ref 65–99)
POTASSIUM: 3.7 mmol/L (ref 3.5–5.1)
Sodium: 138 mmol/L (ref 135–145)
Total Bilirubin: 0.4 mg/dL (ref 0.3–1.2)
Total Protein: 6.6 g/dL (ref 6.5–8.1)

## 2016-11-04 LAB — CBC WITH DIFFERENTIAL/PLATELET
Basophils Absolute: 0.1 10*3/uL (ref 0–0.1)
Basophils Relative: 1 %
EOS ABS: 0.1 10*3/uL (ref 0–0.7)
Eosinophils Relative: 1 %
HCT: 30.9 % — ABNORMAL LOW (ref 40.0–52.0)
Hemoglobin: 10.8 g/dL — ABNORMAL LOW (ref 13.0–18.0)
LYMPHS ABS: 2.4 10*3/uL (ref 1.0–3.6)
Lymphocytes Relative: 32 %
MCH: 32.3 pg (ref 26.0–34.0)
MCHC: 35 g/dL (ref 32.0–36.0)
MCV: 92.5 fL (ref 80.0–100.0)
Monocytes Absolute: 0.6 10*3/uL (ref 0.2–1.0)
Monocytes Relative: 8 %
Neutro Abs: 4.4 10*3/uL (ref 1.4–6.5)
Neutrophils Relative %: 58 %
Platelets: 353 10*3/uL (ref 150–440)
RBC: 3.35 MIL/uL — AB (ref 4.40–5.90)
RDW: 13.3 % (ref 11.5–14.5)
WBC: 7.5 10*3/uL (ref 3.8–10.6)

## 2016-11-04 LAB — ETHANOL: ALCOHOL ETHYL (B): 6 mg/dL — AB (ref <5)

## 2016-11-04 LAB — SALICYLATE LEVEL: Salicylate Lvl: <7 mg/dL (ref 2.8–30.0)

## 2016-11-04 LAB — TROPONIN I: Troponin I: <0.03 ng/mL (ref <0.03)

## 2016-11-04 LAB — ACETAMINOPHEN LEVEL: Acetaminophen (Tylenol), Serum: <10 ug/mL — ABNORMAL LOW (ref 10–30)

## 2016-11-04 LAB — LIPASE, BLOOD: Lipase: 25 U/L (ref 11–51)

## 2016-11-04 LAB — AMMONIA: Ammonia: 25 umol/L (ref 9–35)

## 2016-11-04 MED ORDER — SODIUM CHLORIDE 0.9 % IV BOLUS (SEPSIS)
1000.0000 mL | Freq: Once | INTRAVENOUS | Status: AC
  Administered 2016-11-04: 1000 mL via INTRAVENOUS

## 2016-11-04 NOTE — ED Notes (Signed)
Patient transported to [place] 

## 2016-11-04 NOTE — ED Provider Notes (Signed)
[facility] Emergency Department Provider Note  ____________________________________________  Time seen: Approximately 11:47 PM  I have reviewed the triage vital signs and the nursing notes.   HISTORY  Chief Complaint Altered Mental Status  Level 5 caveat:  Portions of the history and physical were unable to be obtained due to: Altered mental status   HPI Sean Walsh is a 61 y.o. male brought to the ED by EMSfrom nursing home where he was agitated kicking a window Center running outside. He has a history of homelessness. He was recently admitted at [facility] 10/18/2016 for altered mental status rhabdomyolysis. A portion of his discharge summary is as follows.  Patient denies any acute complaints no chest pain. He was chewing gum. He wants a ride to the truck stop.   Acute encephalopathy   Rhabdomyolysis   Facial fracture due to fall [facility](HCC)   Substance abuse   Refeeding syndrome  Discharge Medications: Allergies as of 10/26/2016   No Known Allergies        Medication List    TAKE these medications   amLODipine 5 MG tablet Commonly known as:  NORVASC Take 1 tablet (5 mg total) by mouth daily. Start taking on:  10/27/2016   bacitracin ointment Apply topically 2 (two) times daily.   folic acid 1 MG tablet Commonly known as:  FOLVITE Take 1 tablet (1 mg total) by mouth daily.   insulin aspart 100 UNIT/ML injection Commonly known as:  novoLOG Inject 13 Units into the skin 3 (three) times daily with meals.   insulin glargine 100 UNIT/ML injection Commonly known as:  LANTUS Inject 0.2 mLs (20 Units total) into the skin at bedtime.   multivitamin with minerals Tabs tablet Take 1 tablet by mouth daily. Start taking on:  10/27/2016   pantoprazole 40 MG tablet Commonly known as:  PROTONIX Take 1 tablet (40 mg total) by mouth daily. Start taking on:  10/27/2016   phosphorus [phone] MG tablet Commonly known as:  K PHOS  NEUTRAL Take 2 tablets (500 mg total) by mouth 2 (two) times daily.   sodium chloride 0.65 % Soln nasal spray Commonly known as:  OCEAN Place 1 spray into both nostrils as needed for congestion.   thiamine 100 MG/ML injection Commonly known as:  B-1 Inject 2.5 mLs (250 mg total) into the vein daily. Start taking on:  10/27/2016   thiamine 100 MG tablet Take 1 tablet (100 mg total) by mouth 3 (three) times daily. Start taking on:  10/29/2016   thiamine 100 MG tablet Take 1 tablet (100 mg total) by mouth daily. Start taking on:  11/12/2016         Past Medical History:  Diagnosis Date  . Diabetes mellitus without complication (HCC)   . Hypertension   . Neuromuscular disorder (HCC)   . Substance abuse      Patient Active Problem List   Diagnosis Date Noted  . 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 HTN (hypertension) 10/21/2016  . 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 Acute encephalopathy 10/19/2016  . Rhabdomyolysis 10/18/2016  . Facial fracture due to fall (HCC) 10/18/2016  . Hypokalemia 10/18/2016  . Substance abuse 10/18/2016  . Diabetes (HCC) 10/18/2016     Past Surgical History:  Procedure Laterality Date  . NO PAST SURGERIES       Prior to Admission medications   Medication Sig Start Date End Date Taking? Authorizing Provider  amLODipine (NORVASC) 5 MG tablet Take 1  tablet (5 mg total) by mouth daily. 10/27/16  Yes Eulah Pont, MD  bacitracin ointment Apply topically 2 (two) times daily. 10/26/16  Yes Eulah Pont, MD  folic acid (FOLVITE) 1 MG tablet Take 1 tablet (1 mg total) by mouth daily. 10/26/16  Yes Eulah Pont, MD  insulin aspart (NOVOLOG) 100 UNIT/ML injection Inject 13 Units into the skin 3 (three) times daily with meals. 10/26/16  Yes Eulah Pont, MD  insulin glargine (LANTUS) 100 UNIT/ML injection Inject 0.2 mLs (20 Units total) into the skin at bedtime. 10/26/16  Yes Eulah Pont, MD  Multiple Vitamin (MULTIVITAMIN WITH  MINERALS) TABS tablet Take 1 tablet by mouth daily. 10/27/16  Yes Eulah Pont, MD  pantoprazole (PROTONIX) 40 MG tablet Take 1 tablet (40 mg total) by mouth daily. 10/27/16  Yes Eulah Pont, MD  phosphorus (K PHOS NEUTRAL) [phone] MG tablet Take 2 tablets (500 mg total) by mouth 2 (two) times daily. 10/26/16  Yes Eulah Pont, MD  sodium chloride (OCEAN) 0.65 % SOLN nasal spray Place 1 spray into both nostrils as needed for congestion. 10/26/16  Yes Eulah Pont, MD  thiamine 100 MG tablet Take 1 tablet (100 mg total) by mouth 3 (three) times daily. 10/29/16 11/12/16 Yes Eulah Pont, MD  thiamine 100 MG tablet Take 1 tablet (100 mg total) by mouth daily. 11/12/16  Yes Eulah Pont, MD     Allergies Patient has no known allergies.   History reviewed. No pertinent family history.  Social History Social History  Substance Use Topics  . Smoking status: Smoker, Current Status Unknown    Packs/day: 2.00    Years: 10.00    Types: Cigarettes  . Smokeless tobacco: Current User  . Alcohol use Not on file     Comment: unknown    Review of Systems  Constitutional:   No fever or chills.  ENT:   No sore throat. No rhinorrhea. Cardiovascular:   No chest pain or syncope. Respiratory:   No dyspnea or cough. Gastrointestinal:   Negative for abdominal pain, vomiting and diarrhea.  Musculoskeletal:   Negative for focal pain or swelling All other systems reviewed and are negative except as documented above in ROS and HPI.  ____________________________________________   PHYSICAL EXAM:  VITAL SIGNS: ED Triage Vitals  Enc Vitals Group     BP 11/04/16 2100 132/66     Pulse Rate 11/04/16 2100 94     Resp 11/04/16 2100 18     Temp 11/04/16 2100 98 F (36.7 C)     Temp Source 11/04/16 2100 Oral     SpO2 11/04/16 2049 98 %     Weight 11/04/16 2101 150 lb (68 kg)     Height 11/04/16 2101 5\' 6"  (1.676 m)     Head Circumference --      Peak Flow --      Pain Score --      Pain Loc --      Pain Edu? --       Excl. in GC? --     Vital signs reviewed, nursing assessments reviewed.   Constitutional:   Alert and orientedTo self. Well appearing and in no distress. Energetic Eyes:   No scleral icterus.  EOMI. No nystagmus. No conjunctival pallor. PERRL. ENT   Head:   Normocephalic and atraumatic.   Nose:   No congestion/rhinnorhea.    Mouth/Throat:   MMM, no pharyngeal erythema. No peritonsillar mass.    Neck:   No meningismus. Full ROM Hematological/Lymphatic/Immunilogical:  No cervical lymphadenopathy. Cardiovascular:   RRR. Symmetric bilateral radial and DP pulses.  No murmurs.  Respiratory:   Normal respiratory effort without tachypnea/retractions. Breath sounds are clear and equal bilaterally. No wheezes/rales/rhonchi. Gastrointestinal:   Soft and nontender. Non distended. There is no CVA tenderness.  No rebound, rigidity, or guarding. Genitourinary:   deferred Musculoskeletal:   Normal range of motion in all extremities. No joint effusions.  No lower extremity tenderness.  No edema. Neurologic:   Normal speech and language.  Motor grossly intact. No gross focal neurologic deficits are appreciated.  Skin:    Skin is warm, dry and intact. No rash noted.  No petechiae, purpura, or bullae.  ____________________________________________    LABS (pertinent positives/negatives) (all labs ordered are listed, but only abnormal results are displayed) Labs Reviewed  ACETAMINOPHEN LEVEL - Abnormal; Notable for the following:       Result Value   Acetaminophen (Tylenol), Serum <10 (*)    All other components within normal limits  COMPREHENSIVE METABOLIC PANEL - Abnormal; Notable for the following:    Glucose, Bld 231 (*)    BUN 21 (*)    Calcium 8.7 (*)    Albumin 2.9 (*)    All other components within normal limits  ETHANOL - Abnormal; Notable for the following:    Alcohol, Ethyl (B) 6 (*)    All other components within normal limits  CBC WITH DIFFERENTIAL/PLATELET -  Abnormal; Notable for the following:    RBC 3.35 (*)    Hemoglobin 10.8 (*)    HCT 30.9 (*)    All other components within normal limits  LIPASE, BLOOD  SALICYLATE LEVEL  AMMONIA  TROPONIN I  URINALYSIS, COMPLETE (UACMP) WITH MICROSCOPIC  CBC WITH DIFFERENTIAL/PLATELET  URINE DRUG SCREEN, QUALITATIVE ([facility] ONLY)   ____________________________________________   EKG  Interpreted by me Sinus rhythm rate of 95, normal axis and intervals. Normal QRS ST segments and T waves. 2 PVCs on the strip.  ____________________________________________    RADIOLOGY  Dg Chest 2 View  Result Date: 11/04/2016 CLINICAL DATA:  Altered mental status. EXAM: CHEST  2 VIEW COMPARISON:  Chest x-ray dated 10/18/2016. FINDINGS: Heart size is normal. Overall cardiomediastinal silhouette is stable. Atherosclerotic changes noted at the aortic arch. Lungs are clear.  No pleural effusion or pneumothorax seen. Osseous structures about the chest are unremarkable. Large amount of recently ingested material noted in the stomach, incompletely imaged. IMPRESSION: No active cardiopulmonary disease. No evidence of pneumonia or pulmonary edema. Aortic atherosclerosis. Electronically Signed   By: Bary Richard M.D.   On: 11/04/2016 21:30   Ct Head Wo Contrast  Result Date: 11/04/2016 CLINICAL DATA:  Altered mental status EXAM: CT HEAD WITHOUT CONTRAST TECHNIQUE: Contiguous axial images were obtained from the base of the skull through the vertex without intravenous contrast. COMPARISON:  10/18/2016 FINDINGS: Brain: No evidence of acute infarction, hemorrhage, hydrocephalus, extra-axial collection or mass lesion/mass effect. Ventricles and sulci are enlarged reflecting mild atrophy, greater than generally seen in this patient's age. Mild periventricular white matter hypoattenuation is noted consistent with chronic microvascular ischemic change. Vascular: No hyperdense vessel or unexpected calcification. Skull: Normal. Negative for  fracture or focal lesion. Sinuses/Orbits: Visualize globes and orbits are unremarkable. Visualized sinuses and mastoid air cells are clear. Other: None. IMPRESSION: 1. No acute intracranial abnormalities. 2. Mild atrophy and chronic microvascular ischemic change. Electronically Signed   By: Amie Portland M.D.   On: 11/04/2016 21:44    ____________________________________________   PROCEDURES Procedures  ____________________________________________  INITIAL IMPRESSION / ASSESSMENT AND PLAN / ED COURSE  Pertinent labs & imaging results that were available during my care of the patient were reviewed by me and considered in my medical decision making (see chart for details).  Patient well appearing no acute distress but with altered mental status. Based on previous discharge summary this could be cocaine intoxication. Patient will need to be observed in the ED, we'll try to obtain a UDS, get a psychiatry consult. Does not appear to be traumatic or metabolic. Patient is not septic.      ____________________________________________   FINAL CLINICAL IMPRESSION(S) / ED DIAGNOSES  Final diagnoses:  Confusion      New Prescriptions   No medications on file     Portions of this note were generated with dragon dictation software. Dictation errors may occur despite best attempts at proofreading.    Sharman Cheek, MD 11/04/16 2352

## 2016-11-04 NOTE — ED Triage Notes (Signed)
Patient presents to Emergency Department via [facility] with complaints of altered mental status.  Pt is from [facility] where he has been for the last week, EMS was called for pt "kicking windows, running outside up a hill, swatting at staff,".  Staff reports pt itching at genitals.  Pt was found initially hypotensive at 96/70 then 106/78.  Hx of homelessness, DM, and HTN.  CBG of 228.  Pt [facility] to this RN [facility] [place] [facility] [place] and it's [birth year] "1979....something"  Pt denies SI/HI and hallucinations.

## 2016-11-05 NOTE — ED Notes (Signed)
BEHAVIORAL HEALTH ROUNDING  Patient sleeping: No.  Patient alert and oriented: yes oriented to self only which is his baseline Behavior appropriate: Yes. ; If no, describe:  Nutrition and fluids offered: Yes  Toileting and hygiene offered: Yes  Sitter present: not applicable, Q 15 min safety rounds and observation.  Law enforcement present: Yes ODS 

## 2016-11-05 NOTE — ED Provider Notes (Signed)
Specialist on call recommends no inpatient admission. He'll be discharged back home to his nursing facility. He likely has a component of dementia.   Merrily Brittleifenbark, Merleen Picazo, MD 11/05/16 [phone]

## 2016-11-05 NOTE — ED Notes (Signed)
BEHAVIORAL HEALTH ROUNDING  Patient sleeping: No.  Patient alert and oriented: yes oriented to self only which is his baseline Behavior appropriate: Yes. ; If no, describe:  Nutrition and fluids offered: Yes  Toileting and hygiene offered: Yes  Sitter present: not applicable, Q 15 min safety rounds and observation.  Law enforcement present: Yes ODS

## 2016-11-05 NOTE — ED Notes (Signed)
Pt unable to sign discharge due to AMS. Report to RN at [facility].

## 2016-11-05 NOTE — ED Notes (Signed)
Pt noted to be covered in smears of BM. Pt taken to shower by tech and assisted with shower.

## 2016-11-05 NOTE — Discharge Instructions (Signed)
Please have [name] follow-up with his primary care physician and psychiatry as an outpatient. Return him to the emergency department for any concerns.

## 2016-11-05 NOTE — ED Notes (Signed)

## 2016-11-05 NOTE — ED Notes (Signed)
Report called to Laurena Beringindy Clapp, RN at [facility]

## 2016-11-11 ENCOUNTER — Emergency Department
Admission: EM | Admit: 2016-11-11 | Discharge: 2016-11-12 | Disposition: A | Discharge status: Home / Self Care | Payer: No Typology Code available for payment source | Attending: Emergency Medicine | Admitting: Emergency Medicine

## 2016-11-11 DIAGNOSIS — Z794 Long term (current) use of insulin: Secondary | ICD-10-CM | POA: Diagnosis not present

## 2016-11-11 DIAGNOSIS — E119 Type 2 diabetes mellitus without complications: Secondary | ICD-10-CM | POA: Diagnosis not present

## 2016-11-11 DIAGNOSIS — F17210 Nicotine dependence, cigarettes, uncomplicated: Secondary | ICD-10-CM | POA: Diagnosis not present

## 2016-11-11 DIAGNOSIS — I10 Essential (primary) hypertension: Secondary | ICD-10-CM | POA: Diagnosis not present

## 2016-11-11 DIAGNOSIS — Z79899 Other long term (current) drug therapy: Secondary | ICD-10-CM | POA: Diagnosis not present

## 2016-11-11 DIAGNOSIS — F919 Conduct disorder, unspecified: Secondary | ICD-10-CM | POA: Diagnosis present

## 2016-11-11 DIAGNOSIS — R419 Unspecified symptoms and signs involving cognitive functions and awareness: Secondary | ICD-10-CM | POA: Diagnosis not present

## 2016-11-11 LAB — ETHANOL: Alcohol, Ethyl (B): <5 mg/dL (ref <5)

## 2016-11-11 LAB — CBC
HCT: 33.1 % — ABNORMAL LOW (ref 40.0–52.0)
Hemoglobin: 11.3 g/dL — ABNORMAL LOW (ref 13.0–18.0)
MCH: 31.4 pg (ref 26.0–34.0)
MCHC: 34.2 g/dL (ref 32.0–36.0)
MCV: 91.8 fL (ref 80.0–100.0)
Platelets: 375 10*3/uL (ref 150–440)
RBC: 3.61 MIL/uL — ABNORMAL LOW (ref 4.40–5.90)
RDW: 13.8 % (ref 11.5–14.5)
WBC: 10.5 10*3/uL (ref 3.8–10.6)

## 2016-11-11 LAB — ACETAMINOPHEN LEVEL: Acetaminophen (Tylenol), Serum: <10 ug/mL — ABNORMAL LOW (ref 10–30)

## 2016-11-11 LAB — COMPREHENSIVE METABOLIC PANEL
ALK PHOS: 145 U/L — AB (ref 38–126)
ALT: 37 U/L (ref 17–63)
AST: 33 U/L (ref 15–41)
Albumin: 3.5 g/dL (ref 3.5–5.0)
Anion gap: 8 (ref 5–15)
BUN: 39 mg/dL — AB (ref 6–20)
CALCIUM: 9 mg/dL (ref 8.9–10.3)
CO2: 28 mmol/L (ref 22–32)
CREATININE: 1.29 mg/dL — AB (ref 0.61–1.24)
Chloride: 98 mmol/L — ABNORMAL LOW (ref 101–111)
GFR, EST NON AFRICAN AMERICAN: 59 mL/min — AB (ref >60)
Glucose, Bld: 310 mg/dL — ABNORMAL HIGH (ref 65–99)
Potassium: 4.5 mmol/L (ref 3.5–5.1)
SODIUM: 134 mmol/L — AB (ref 135–145)
Total Bilirubin: 0.5 mg/dL (ref 0.3–1.2)
Total Protein: 7.6 g/dL (ref 6.5–8.1)

## 2016-11-11 LAB — SALICYLATE LEVEL

## 2016-11-11 NOTE — ED Triage Notes (Signed)
Pt to triage ambulatory with [employer] officer Earlene PlaterWallace. Pt brought in with IVC papers after he went out a window at [facility] and wandered away.  Pt talking in full and complete sentences with no difficulty at this time.

## 2016-11-11 NOTE — ED Provider Notes (Signed)
[facility] Emergency Department Provider Note  ____________________________________________   First MD Initiated Contact with Patient 11/11/16 2224     (approximate)  I have reviewed the triage vital signs and the nursing notes.   HISTORY  Chief Complaint Other (IVC) and Medical Clearance   HPI Sean Walsh is a 61 y.o. male with a history of diabetes, hypertension substance abuse who is presenting to the emergency department today under involuntary commitment after climbing out of a window and running away from his residence. He denies this and also denies any suicidal or homicidal ideation. He is requesting food at this time. He is denying any pain or other medical complaints.   Past Medical History:  Diagnosis Date  . Diabetes mellitus without complication (HCC)   . Hypertension   . Neuromuscular disorder (HCC)   . Substance abuse     Patient Active Problem List   Diagnosis Date Noted  . 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 HTN (hypertension) 10/21/2016  . 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 Acute encephalopathy 10/19/2016  . Rhabdomyolysis 10/18/2016  . Facial fracture due to fall (HCC) 10/18/2016  . Hypokalemia 10/18/2016  . Substance abuse 10/18/2016  . Diabetes (HCC) 10/18/2016    Past Surgical History:  Procedure Laterality Date  . NO PAST SURGERIES      Prior to Admission medications   Medication Sig Start Date End Date Taking? Authorizing Provider  amLODipine (NORVASC) 5 MG tablet Take 1 tablet (5 mg total) by mouth daily. 10/27/16   Eulah PontBlum, Nina, MD  bacitracin ointment Apply topically 2 (two) times daily. 10/26/16   Eulah PontBlum, Nina, MD  folic acid (FOLVITE) 1 MG tablet Take 1 tablet (1 mg total) by mouth daily. 10/26/16   Eulah PontBlum, Nina, MD  insulin aspart (NOVOLOG) 100 UNIT/ML injection Inject 13 Units into the skin 3 (three) times daily with meals. 10/26/16   Eulah PontBlum, Nina, MD  insulin glargine (LANTUS) 100  UNIT/ML injection Inject 0.2 mLs (20 Units total) into the skin at bedtime. 10/26/16   Eulah PontBlum, Nina, MD  Multiple Vitamin (MULTIVITAMIN WITH MINERALS) TABS tablet Take 1 tablet by mouth daily. 10/27/16   Eulah PontBlum, Nina, MD  pantoprazole (PROTONIX) 40 MG tablet Take 1 tablet (40 mg total) by mouth daily. 10/27/16   Eulah PontBlum, Nina, MD  phosphorus (K PHOS NEUTRAL) [id number] MG tablet Take 2 tablets (500 mg total) by mouth 2 (two) times daily. 10/26/16   Eulah PontBlum, Nina, MD  sodium chloride (OCEAN) 0.65 % SOLN nasal spray Place 1 spray into both nostrils as needed for congestion. 10/26/16   Eulah PontBlum, Nina, MD  thiamine 100 MG tablet Take 1 tablet (100 mg total) by mouth 3 (three) times daily. 10/29/16 11/12/16  Eulah PontBlum, Nina, MD  thiamine 100 MG tablet Take 1 tablet (100 mg total) by mouth daily. 11/12/16   Eulah PontBlum, Nina, MD    Allergies Patient has no known allergies.  No family history on file.  Social History Social History  Substance Use Topics  . Smoking status: Smoker, Current Status Unknown    Packs/day: 2.00    Years: 10.00    Types: Cigarettes  . Smokeless tobacco: Current User  . Alcohol use Not on file     Comment: unknown    Review of Systems  Constitutional: No fever/chills Eyes: No visual changes. ENT: No sore throat. Cardiovascular: Denies chest pain. Respiratory: Denies shortness of breath. Gastrointestinal: No abdominal pain.  No nausea, no vomiting.  No diarrhea.  No constipation.  Genitourinary: Negative for dysuria. Musculoskeletal: Negative for back pain. Skin: Negative for rash. Neurological: Negative for headaches, focal weakness or numbness.   ____________________________________________   PHYSICAL EXAM:  VITAL SIGNS: ED Triage Vitals  Enc Vitals Group     BP 11/11/16 2157 115/60     Pulse Rate 11/11/16 2157 91     Resp 11/11/16 2157 18     Temp 11/11/16 2157 98.6 F (37 C)     Temp Source 11/11/16 2157 Oral     SpO2 11/11/16 2157 100 %     Weight 11/11/16 2156 122 lb (55.3  kg)     Height 11/11/16 2156 5\' 6"  (1.676 m)     Head Circumference --      Peak Flow --      Pain Score 11/11/16 2154 0     Pain Loc --      Pain Edu? --      Excl. in GC? --     Constitutional: Alert and oriented. Well appearing and in no acute distress. Eyes: Conjunctivae are normal.  Head: Atraumatic. Nose: No congestion/rhinnorhea. Mouth/Throat: Mucous membranes are moist.  Neck: No stridor.   Cardiovascular: Normal rate, regular rhythm. Grossly normal heart sounds.   Respiratory: Normal respiratory effort.  No retractions. Lungs CTAB. Gastrointestinal: Soft and nontender. No distention.  Musculoskeletal: No lower extremity tenderness nor edema.  No joint effusions. Neurologic:  Normal speech and language. No gross focal neurologic deficits are appreciated. Skin:  Skin is warm, dry and intact. No rash noted. Psychiatric: Mood and affect are normal. Speech and behavior are normal.  ____________________________________________   LABS (all labs ordered are listed, but only abnormal results are displayed)  Labs Reviewed  COMPREHENSIVE METABOLIC PANEL - Abnormal; Notable for the following:       Result Value   Sodium 134 (*)    Chloride 98 (*)    Glucose, Bld 310 (*)    BUN 39 (*)    Creatinine, Ser 1.29 (*)    Alkaline Phosphatase 145 (*)    GFR calc non Af Amer 59 (*)    All other components within normal limits  ACETAMINOPHEN LEVEL - Abnormal; Notable for the following:    Acetaminophen (Tylenol), Serum <10 (*)    All other components within normal limits  CBC - Abnormal; Notable for the following:    RBC 3.61 (*)    Hemoglobin 11.3 (*)    HCT 33.1 (*)    All other components within normal limits  ETHANOL  SALICYLATE LEVEL  URINE DRUG SCREEN, QUALITATIVE ([facility] ONLY)   ____________________________________________  EKG   ____________________________________________  RADIOLOGY   ____________________________________________   PROCEDURES  Procedure(s)  performed:   Procedures  Critical Care performed:   ____________________________________________   INITIAL IMPRESSION / ASSESSMENT AND PLAN / ED COURSE  Pertinent labs & imaging results that were available during my care of the patient were reviewed by me and considered in my medical decision making (see chart for details).  I will uphold the patient's involuntary commitment. Patient pending specialty on-call psychiatry.      ____________________________________________   FINAL CLINICAL IMPRESSION(S) / ED DIAGNOSES  Dementia    NEW MEDICATIONS STARTED DURING THIS VISIT:  New Prescriptions   No medications on file     Note:  This document was prepared using Dragon voice recognition software and may include unintentional dictation errors.     Myrna Blazer, MD 11/11/16 [phone]

## 2016-11-11 NOTE — ED Notes (Signed)
Pt arrived with [employer] officer Earlene PlaterWallace with IVC papers from [facility]; pt kicked out windows and was found down the road from the facility; staff reports pt is combative; pt arrived calm and cooperative; says he just wants to get back home so he can get some rest; recently discharged from [facility] after staying one week

## 2016-11-11 NOTE — ED Notes (Signed)
Pt dressed out in wine scrubs by Patsey BertholdMike C, EDT/Medic. Per Kathlene NovemberMike pt has 2 pairs of pants, 1 pair of underwear, 2 shirts and 1 black bedroom shoe.

## 2016-11-12 LAB — URINE DRUG SCREEN, QUALITATIVE (ARMC ONLY)
AMPHETAMINES, UR SCREEN: NOT DETECTED
BENZODIAZEPINE, UR SCRN: NOT DETECTED
Barbiturates, Ur Screen: NOT DETECTED
Cannabinoid 50 Ng, Ur ~~LOC~~: NOT DETECTED
Cocaine Metabolite,Ur ~~LOC~~: NOT DETECTED
MDMA (ECSTASY) UR SCREEN: NOT DETECTED
METHADONE SCREEN, URINE: NOT DETECTED
Opiate, Ur Screen: NOT DETECTED
Phencyclidine (PCP) Ur S: NOT DETECTED
Tricyclic, Ur Screen: NOT DETECTED

## 2016-11-12 NOTE — ED Notes (Signed)
BEHAVIORAL HEALTH ROUNDING Patient sleeping: Yes.   Patient alert and oriented: not applicable SLEEPING Behavior appropriate: Yes.  ; If no, describe: SLEEPING Nutrition and fluids offered: No SLEEPING Toileting and hygiene offered: NoSLEEPING Sitter present: not applicable, Q 15 min safety rounds and observation. Law enforcement present: Yes ODS 

## 2016-11-12 NOTE — ED Notes (Signed)
Pt ambulated to bathroom unassisted and returned to room. Pt provided UA sample and pt was given milk. Pt is back in bed resting.

## 2016-11-12 NOTE — ED Notes (Signed)
Report given to SOC MD.  

## 2016-11-12 NOTE — ED Notes (Signed)
[facility] .

## 2016-11-12 NOTE — ED Notes (Signed)
Attempted to call report to [facility]. Spoke with Ginger Giannoni, LPN who states she will have her supervisor Sabino Niemanncynthia Poole call me back as she thought the patient was not returning to the nursing home.

## 2016-11-12 NOTE — ED Notes (Signed)
BEHAVIORAL HEALTH ROUNDING  Patient sleeping: No.  Patient alert and oriented: yes  Behavior appropriate: Yes. ; If no, describe:  Nutrition and fluids offered: Yes  Toileting and hygiene offered: Yes  Sitter present: not applicable, Q 15 min safety rounds and observation.  Law enforcement present: Yes ODS  

## 2016-11-12 NOTE — ED Notes (Signed)
This RN to room after hearing noise, pt on floor with mattress slid off stretcher.  Alyssia, NT to room as well.  Pt accessed for injury, without obvious bleeding or deformity.  Pt denies pain.  Pt stood and returned to bed by self, appears to desire to "go to bed".  Rails up for safety. Dewayne HatchAnn, RN informed and she informed Dr York CeriseFOrbach.

## 2016-11-12 NOTE — Discharge Instructions (Signed)

## 2016-11-12 NOTE — Progress Notes (Signed)
Clinical Child psychotherapistocial Worker (CSW) received consult that [name] will not take patient back today. CSW contacted [name] administrator to discuss case. Per Sean Walsh patient can return to [name] today from the ED. RN aware of above and will call report and arrange EMS for transport. Please reconsult if future social work needs arise. CSW signing off.   [employer]iley Andra Heslin, LCSW [phone]

## 2016-11-12 NOTE — ED Notes (Signed)
Spoke with Sabino Niemannynthia Poole, RN ADON at [facility] who states they will not take the patient back at this time. She wants to speak with her DON prior to saying she will take the patient back. Will inform Dr York CeriseForbach and will also ask him to have social worker consult as they will know if the facility can refuse to take the patient.

## 2016-11-12 NOTE — ED Provider Notes (Addendum)
-----------------------------------------   4:35 AM on 11/12/2016 -----------------------------------------   Blood pressure 115/60, pulse 91, temperature 98.6 F (37 C), temperature source Oral, resp. rate 18, height 1.676 m (5\' 6" ), weight 55.3 kg (122 lb), SpO2 100 %.  The patient had no acute events since last update.  Calm and cooperative at this time.  [facility] who feels the patient does not require inpatient treatment and does not meet requirements for IVC.  The IVC has been rescinded.  Patient will follow up as an outpatient.   Loleta RoseForbach, Newton Frutiger, MD 11/12/16 0438   ----------------------------------------- 6:46 AM on 11/12/2016 -----------------------------------------   Blood pressure (!) 166/87, pulse 66, temperature 98.1 F (36.7 C), temperature source Oral, resp. rate 18, height 1.676 m (5\' 6" ), weight 55.3 kg (122 lb), SpO2 (!) 70 %.  Calm and cooperative at this time.  The patient's care home refuses to take him back at this time.  Awaiting social work evaluation.  Nurses are heading up the process.     Loleta RoseForbach, Hermina Barnard, MD 11/12/16 [id number]

## 2016-11-12 NOTE — ED Notes (Signed)

## 2016-11-12 NOTE — ED Notes (Signed)
Pt transported by EMS and given 2 bags of belongings upon discharge. Labeled bag 1 of 2 and 2 of 2.

## 2016-11-12 NOTE — ED Notes (Signed)
Camera removed from room.  

## 2016-11-12 NOTE — ED Notes (Signed)
Pt given breakfast tray

## 2016-11-15 ENCOUNTER — Encounter (HOSPITAL_COMMUNITY): Payer: Self-pay | Admitting: Emergency Medicine

## 2016-11-15 ENCOUNTER — Emergency Department (HOSPITAL_COMMUNITY)
Admission: EM | Admit: 2016-11-15 | Discharge: 2016-11-16 | Disposition: A | Discharge status: Home / Self Care | Payer: Medicare Other | Attending: Emergency Medicine | Admitting: Emergency Medicine

## 2016-11-15 DIAGNOSIS — S30861A Insect bite (nonvenomous) of abdominal wall, initial encounter: Secondary | ICD-10-CM | POA: Insufficient documentation

## 2016-11-15 DIAGNOSIS — Z794 Long term (current) use of insulin: Secondary | ICD-10-CM | POA: Insufficient documentation

## 2016-11-15 DIAGNOSIS — Y92198 Other place in other specified residential institution as the place of occurrence of the external cause: Secondary | ICD-10-CM | POA: Diagnosis not present

## 2016-11-15 DIAGNOSIS — W57XXXA Bitten or stung by nonvenomous insect and other nonvenomous arthropods, initial encounter: Secondary | ICD-10-CM | POA: Diagnosis not present

## 2016-11-15 DIAGNOSIS — Y999 Unspecified external cause status: Secondary | ICD-10-CM | POA: Diagnosis not present

## 2016-11-15 DIAGNOSIS — Z79899 Other long term (current) drug therapy: Secondary | ICD-10-CM | POA: Insufficient documentation

## 2016-11-15 DIAGNOSIS — I10 Essential (primary) hypertension: Secondary | ICD-10-CM | POA: Insufficient documentation

## 2016-11-15 DIAGNOSIS — R4182 Altered mental status, unspecified: Secondary | ICD-10-CM | POA: Diagnosis present

## 2016-11-15 DIAGNOSIS — Z9114 Patient's other noncompliance with medication regimen: Secondary | ICD-10-CM

## 2016-11-15 DIAGNOSIS — Y9389 Activity, other specified: Secondary | ICD-10-CM | POA: Insufficient documentation

## 2016-11-15 DIAGNOSIS — F17210 Nicotine dependence, cigarettes, uncomplicated: Secondary | ICD-10-CM | POA: Diagnosis not present

## 2016-11-15 DIAGNOSIS — R419 Unspecified symptoms and signs involving cognitive functions and awareness: Secondary | ICD-10-CM | POA: Insufficient documentation

## 2016-11-15 DIAGNOSIS — E119 Type 2 diabetes mellitus without complications: Secondary | ICD-10-CM | POA: Insufficient documentation

## 2016-11-15 MED ORDER — SODIUM CHLORIDE 0.9 % IV SOLN: INTRAVENOUS | Status: DC

## 2016-11-15 MED ORDER — SODIUM CHLORIDE 0.9 % IV BOLUS (SEPSIS)
1000.0000 mL | Freq: Once | INTRAVENOUS | Status: AC
Start: 2016-11-16 — End: 2016-11-16
  Administered 2016-11-16: 1000 mL via INTRAVENOUS

## 2016-11-15 NOTE — ED Notes (Signed)
Palumbo, MD at bedside assessing pt; awaiting orders for additional intervention. Pt disoriented repeating "I am hungry."

## 2016-11-15 NOTE — ED Triage Notes (Signed)
Per EMS [facility] of [facility]; alert to self.

## 2016-11-15 NOTE — ED Provider Notes (Signed)
[facility]-EMERGENCY DEPT Provider Note   CSN: [id number] Arrival date & time: 11/15/16  2338  By signing my name below, I, Diona Browner, attest that this documentation has been prepared under the direction and in the presence of Macario Shear, MD. Electronically Signed: Diona Browner, ED Scribe. 11/15/16. 11:52 PM.  LEVEL V CAVEAT: HPI and ROS limited due to altered sensorium.  History   Chief Complaint Chief Complaint  Patient presents with  . Altered Mental Status    HPI Sean Walsh is a 61 y.o. male BIB EMS, with a PMHx of DM, HTN, and substance abuse who presents to the Emergency Department with a chief complaint of AMS. Per EMS, pt was found outside of a [facility]. He has not been taking his prescribed medication. He had a "dog tick" on his right groin. Pt denies drinking PTA.   The history is provided by the patient and the EMS personnel. The history is limited by the condition of the patient. No language interpreter was used.  Altered Mental Status   This is a new problem. Episode onset: unknown. The problem has not changed since onset.Associated symptoms include somnolence. Risk factors include alcohol intake and illicit drug use. His past medical history does not include seizures.    Past Medical History:  Diagnosis Date  . Diabetes mellitus without complication (HCC)   . Hypertension   . Neuromuscular disorder (HCC)   . Substance abuse     Patient Active Problem List   Diagnosis Date Noted  . 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 HTN (hypertension) 10/21/2016  . 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 Acute encephalopathy 10/19/2016  . Rhabdomyolysis 10/18/2016  . Facial fracture due to fall (HCC) 10/18/2016  . Hypokalemia 10/18/2016  . Substance abuse 10/18/2016  . Diabetes (HCC) 10/18/2016    Past Surgical History:  Procedure Laterality Date  . NO PAST SURGERIES         Home Medications    Prior to Admission medications    Medication Sig Start Date End Date Taking? Authorizing Provider  amLODipine (NORVASC) 5 MG tablet Take 1 tablet (5 mg total) by mouth daily. 10/27/16   Eulah Pont, MD  bacitracin ointment Apply topically 2 (two) times daily. 10/26/16   Eulah Pont, MD  folic acid (FOLVITE) 1 MG tablet Take 1 tablet (1 mg total) by mouth daily. 10/26/16   Eulah Pont, MD  insulin aspart (NOVOLOG) 100 UNIT/ML injection Inject 13 Units into the skin 3 (three) times daily with meals. 10/26/16   Eulah Pont, MD  insulin glargine (LANTUS) 100 UNIT/ML injection Inject 0.2 mLs (20 Units total) into the skin at bedtime. 10/26/16   Eulah Pont, MD  Multiple Vitamin (MULTIVITAMIN WITH MINERALS) TABS tablet Take 1 tablet by mouth daily. 10/27/16   Eulah Pont, MD  pantoprazole (PROTONIX) 40 MG tablet Take 1 tablet (40 mg total) by mouth daily. 10/27/16   Eulah Pont, MD  phosphorus (K PHOS NEUTRAL) [phone] MG tablet Take 2 tablets (500 mg total) by mouth 2 (two) times daily. 10/26/16   Eulah Pont, MD  sodium chloride (OCEAN) 0.65 % SOLN nasal spray Place 1 spray into both nostrils as needed for congestion. 10/26/16   Eulah Pont, MD  thiamine 100 MG tablet Take 1 tablet (100 mg total) by mouth daily. 11/12/16   Eulah Pont, MD    Family History No family history on file.  Social History Social History  Substance Use Topics  . Smoking status: Smoker, Current  Status Unknown    Packs/day: 2.00    Years: 10.00    Types: Cigarettes  . Smokeless tobacco: Current User  . Alcohol use Not on file     Comment: unknown     Allergies   Patient has no known allergies.   Review of Systems Review of Systems  Unable to perform ROS: Mental status change     Physical Exam Updated Vital Signs BP (!) 141/96 (BP Location: Left Arm)   Pulse 92   Temp 98 F (36.7 C) (Oral)   Resp 16   Ht 5\' 6"  (1.676 m)   Wt 122 lb (55.3 kg)   SpO2 97%   BMI 19.69 kg/m   Physical Exam  Constitutional: He appears well-developed and  well-nourished.  HENT:  Head: Normocephalic.  Mouth/Throat: Oropharynx is clear and moist. No oropharyngeal exudate.  Eyes: Conjunctivae and EOM are normal. Pupils are equal, round, and reactive to light. Right eye exhibits no discharge. Left eye exhibits no discharge. No scleral icterus.  Neck: Normal range of motion. Neck supple. No JVD present. No tracheal deviation present.  Trachea is midline. No stridor or carotid bruits.  Cardiovascular: Normal rate, regular rhythm, normal heart sounds and intact distal pulses.   No murmur heard. Pulmonary/Chest: Effort normal and breath sounds normal. No stridor. No respiratory distress. He has no wheezes. He has no rales.  Lungs CTA bilaterally.  Abdominal: Soft. Bowel sounds are normal. He exhibits no distension. There is no tenderness. There is no rebound and no guarding.  Musculoskeletal: Normal range of motion. He exhibits no edema or tenderness.  All compartments are soft. No palpable cords. Well healing scab on right elbow, not infected.  Lymphadenopathy:    He has no cervical adenopathy.  Neurological: He is alert. He has normal reflexes. He displays normal reflexes.  Skin: Skin is warm and dry. Capillary refill takes less than 2 seconds.  No rashes on the skin  Psychiatric: He has a normal mood and affect. His behavior is normal.  Nursing note and vitals reviewed.    ED Treatments / Results  DIAGNOSTIC STUDIES: Oxygen Saturation is 97% on RA, normal by my interpretation.   COORDINATION OF CARE: 11:52 PM-Discussed next steps with pt. Pt verbalized understanding and is agreeable with the plan.   Labs (all labs ordered are listed, but only abnormal results are displayed)  Results for orders placed or performed during the hospital encounter of 11/15/16  Comprehensive metabolic panel  Result Value Ref Range   Sodium 136 135 - 145 mmol/L   Potassium 3.9 3.5 - 5.1 mmol/L   Chloride 103 101 - 111 mmol/L   CO2 23 22 - 32 mmol/L    Glucose, Bld 329 (H) 65 - 99 mg/dL   BUN 23 (H) 6 - 20 mg/dL   Creatinine, Ser 4.09 0.61 - 1.24 mg/dL   Calcium 8.5 (L) 8.9 - 10.3 mg/dL   Total Protein 7.3 6.5 - 8.1 g/dL   Albumin 3.3 (L) 3.5 - 5.0 g/dL   AST 27 15 - 41 U/L   ALT 30 17 - 63 U/L   Alkaline Phosphatase 136 (H) 38 - 126 U/L   Total Bilirubin 0.3 0.3 - 1.2 mg/dL   GFR calc non Af Amer >60 >60 mL/min   GFR calc Af Amer >60 >60 mL/min   Anion gap 10 5 - 15  Salicylate level  Result Value Ref Range   Salicylate Lvl <7.0 2.8 - 30.0 mg/dL  Acetaminophen level  Result  Value Ref Range   Acetaminophen (Tylenol), Serum <10 (L) 10 - 30 ug/mL  Ethanol  Result Value Ref Range   Alcohol, Ethyl (B) <5 <5 mg/dL  Urine rapid drug screen (hosp performed)  Result Value Ref Range   Opiates NONE DETECTED NONE DETECTED   Cocaine NONE DETECTED NONE DETECTED   Benzodiazepines NONE DETECTED NONE DETECTED   Amphetamines NONE DETECTED NONE DETECTED   Tetrahydrocannabinol NONE DETECTED NONE DETECTED   Barbiturates NONE DETECTED NONE DETECTED  CBC WITH DIFFERENTIAL  Result Value Ref Range   WBC 7.4 4.0 - 10.5 K/uL   RBC 3.47 (L) 4.22 - 5.81 MIL/uL   Hemoglobin 11.0 (L) 13.0 - 17.0 g/dL   HCT 16.1 (L) 09.6 - 04.5 %   MCV 92.5 78.0 - 100.0 fL   MCH 31.7 26.0 - 34.0 pg   MCHC 34.3 30.0 - 36.0 g/dL   RDW 40.9 81.1 - 91.4 %   Platelets 268 150 - 400 K/uL   Neutrophils Relative % 50 %   Neutro Abs 3.7 1.7 - 7.7 K/uL   Lymphocytes Relative 40 %   Lymphs Abs 2.9 0.7 - 4.0 K/uL   Monocytes Relative 9 %   Monocytes Absolute 0.7 0.1 - 1.0 K/uL   Eosinophils Relative 1 %   Eosinophils Absolute 0.1 0.0 - 0.7 K/uL   Basophils Relative 0 %   Basophils Absolute 0.0 0.0 - 0.1 K/uL  Urinalysis, Routine w reflex microscopic  Result Value Ref Range   Color, Urine STRAW (A) YELLOW   APPearance CLEAR CLEAR   Specific Gravity, Urine 1.016 1.005 - 1.030   pH 7.0 5.0 - 8.0   Glucose, UA >=500 (A) NEGATIVE mg/dL   Hgb urine dipstick NEGATIVE  NEGATIVE   Bilirubin Urine NEGATIVE NEGATIVE   Ketones, ur NEGATIVE NEGATIVE mg/dL   Protein, ur NEGATIVE NEGATIVE mg/dL   Nitrite NEGATIVE NEGATIVE   Leukocytes, UA NEGATIVE NEGATIVE   RBC / HPF NONE SEEN 0 - 5 RBC/hpf   WBC, UA NONE SEEN 0 - 5 WBC/hpf   Bacteria, UA NONE SEEN NONE SEEN   Squamous Epithelial / LPF NONE SEEN NONE SEEN  CBG monitoring, ED  Result Value Ref Range   Glucose-Capillary 335 (H) 65 - 99 mg/dL  I-stat chem 8, ed  Result Value Ref Range   Sodium 138 135 - 145 mmol/L   Potassium 4.3 3.5 - 5.1 mmol/L   Chloride 101 101 - 111 mmol/L   BUN 32 (H) 6 - 20 mg/dL   Creatinine, Ser 7.82 (L) 0.61 - 1.24 mg/dL   Glucose, Bld 956 (H) 65 - 99 mg/dL   Calcium, Ion 2.13 (L) 1.15 - 1.40 mmol/L   TCO2 29 0 - 100 mmol/L   Hemoglobin 11.2 (L) 13.0 - 17.0 g/dL   HCT 08.6 (L) 57.8 - 46.9 %   Dg Chest 2 View  Result Date: 11/04/2016 CLINICAL DATA:  Altered mental status. EXAM: CHEST  2 VIEW COMPARISON:  Chest x-ray dated 10/18/2016. FINDINGS: Heart size is normal. Overall cardiomediastinal silhouette is stable. Atherosclerotic changes noted at the aortic arch. Lungs are clear.  No pleural effusion or pneumothorax seen. Osseous structures about the chest are unremarkable. Large amount of recently ingested material noted in the stomach, incompletely imaged. IMPRESSION: No active cardiopulmonary disease. No evidence of pneumonia or pulmonary edema. Aortic atherosclerosis. Electronically Signed   By: Bary Richard M.D.   On: 11/04/2016 21:30   Ct Head Wo Contrast  Result Date: 11/04/2016 CLINICAL DATA:  Altered  mental status EXAM: CT HEAD WITHOUT CONTRAST TECHNIQUE: Contiguous axial images were obtained from the base of the skull through the vertex without intravenous contrast. COMPARISON:  10/18/2016 FINDINGS: Brain: No evidence of acute infarction, hemorrhage, hydrocephalus, extra-axial collection or mass lesion/mass effect. Ventricles and sulci are enlarged reflecting mild atrophy,  greater than generally seen in this patient's age. Mild periventricular white matter hypoattenuation is noted consistent with chronic microvascular ischemic change. Vascular: No hyperdense vessel or unexpected calcification. Skull: Normal. Negative for fracture or focal lesion. Sinuses/Orbits: Visualize globes and orbits are unremarkable. Visualized sinuses and mastoid air cells are clear. Other: None. IMPRESSION: 1. No acute intracranial abnormalities. 2. Mild atrophy and chronic microvascular ischemic change. Electronically Signed   By: Amie Portlandavid  Ormond M.D.   On: 11/04/2016 21:44   Ct Head Wo Contrast  Result Date: 10/18/2016 CLINICAL DATA:  Altered mental status, found unresponsive EXAM: CT HEAD WITHOUT CONTRAST CT CERVICAL SPINE WITHOUT CONTRAST TECHNIQUE: Multidetector CT imaging of the head and cervical spine was performed following the standard protocol without intravenous contrast. Multiplanar CT image reconstructions of the cervical spine were also generated. COMPARISON:  None. FINDINGS: CT HEAD FINDINGS Brain: Mild atrophic changes are noted. No findings to suggest acute hemorrhage or acute infarction are seen. No space-occupying mass lesion is noted. Vascular: No hyperdense vessel or unexpected calcification. Skull: The calvarium is intact. Sinuses/Orbits: There are fractures involving the left maxillary antrum in the anterior, superior and lateral walls as well as in the multifocal mildly displaced fracture of the left zygomatic arch and fracture of the left orbital wall. Only minimal soft tissue changes are seen. A small air-fluid level is noted in the left maxillary antrum. Other: None CT CERVICAL SPINE FINDINGS Alignment: Within normal limits. Skull base and vertebrae: 7 cervical segments are well visualized. Multilevel disc space narrowing is noted worst at the C5-6 level. Anterior and posterior osteophytes are noted. Mild facet hypertrophic changes are seen. No acute fracture or acute facet  abnormality is seen. Soft tissues and spinal canal: No specific soft tissue abnormality is noted. Scattered vascular calcifications are seen. Upper chest: Within normal limits. IMPRESSION: CT of the head: No acute intracranial abnormality is noted. There are however multiple fractures involving the left maxillary antrum, left zygomatic arch and lateral wall of the left orbit as described. CT of the cervical spine: Multilevel degenerative change without acute bony abnormality. Electronically Signed   By: Alcide CleverMark  Lukens M.D.   On: 10/18/2016 10:37   Ct Cervical Spine Wo Contrast  Result Date: 10/18/2016 CLINICAL DATA:  Altered mental status, found unresponsive EXAM: CT HEAD WITHOUT CONTRAST CT CERVICAL SPINE WITHOUT CONTRAST TECHNIQUE: Multidetector CT imaging of the head and cervical spine was performed following the standard protocol without intravenous contrast. Multiplanar CT image reconstructions of the cervical spine were also generated. COMPARISON:  None. FINDINGS: CT HEAD FINDINGS Brain: Mild atrophic changes are noted. No findings to suggest acute hemorrhage or acute infarction are seen. No space-occupying mass lesion is noted. Vascular: No hyperdense vessel or unexpected calcification. Skull: The calvarium is intact. Sinuses/Orbits: There are fractures involving the left maxillary antrum in the anterior, superior and lateral walls as well as in the multifocal mildly displaced fracture of the left zygomatic arch and fracture of the left orbital wall. Only minimal soft tissue changes are seen. A small air-fluid level is noted in the left maxillary antrum. Other: None CT CERVICAL SPINE FINDINGS Alignment: Within normal limits. Skull base and vertebrae: 7 cervical segments are well visualized. Multilevel disc  space narrowing is noted worst at the C5-6 level. Anterior and posterior osteophytes are noted. Mild facet hypertrophic changes are seen. No acute fracture or acute facet abnormality is seen. Soft tissues  and spinal canal: No specific soft tissue abnormality is noted. Scattered vascular calcifications are seen. Upper chest: Within normal limits. IMPRESSION: CT of the head: No acute intracranial abnormality is noted. There are however multiple fractures involving the left maxillary antrum, left zygomatic arch and lateral wall of the left orbit as described. CT of the cervical spine: Multilevel degenerative change without acute bony abnormality. Electronically Signed   By: Alcide Clever M.D.   On: 10/18/2016 10:37   Dg Chest Port 1 View  Result Date: 11/16/2016 CLINICAL DATA:  Found outside with altered mental status. EXAM: PORTABLE CHEST 1 VIEW COMPARISON:  11/04/2016 FINDINGS: The cardiomediastinal contours are normal. Again seen thoracic aortic atherosclerosis. Streaky bibasilar atelectasis. Pulmonary vasculature is normal. No consolidation, pleural effusion, or pneumothorax. No acute osseous abnormalities are seen. IMPRESSION: Streaky bibasilar atelectasis. Electronically Signed   By: Rubye Oaks M.D.   On: 11/16/2016 00:50   Dg Chest Port 1 View  Result Date: 10/18/2016 CLINICAL DATA:  Altered mental status.  Patient found down. EXAM: PORTABLE CHEST 1 VIEW COMPARISON:  None. FINDINGS: The heart size and mediastinal contours are within normal limits. Both lungs are clear. The visualized skeletal structures are unremarkable. Aortic atherosclerosis. IMPRESSION: No active disease. Aortic atherosclerosis. Electronically Signed   By: Francene Boyers M.D.   On: 10/18/2016 09:00    EKG  EKG Interpretation  Date/Time:  Thursday November 16 2016 00:14:46 EDT Ventricular Rate:  92 PR Interval:    QRS Duration: 79 QT Interval:  380 QTC Calculation: 471 R Axis:   35 Text Interpretation:  Sinus rhythm Ventricular premature complex Anteroseptal infarct, old Confirmed by Nicanor Alcon, Litisha Guagliardo ([id number]) on 11/16/2016 12:21:36 AM       Radiology No results found.  Procedures .Foreign Body Removal Date/Time:  11/16/2016 12:09 AM Performed by: Cy Blamer Authorized by: Cy Blamer  Consent: Verbal consent obtained. Consent given by: patient Patient understanding: patient states understanding of the procedure being performed Patient consent: the patient's understanding of the procedure matches consent given Relevant documents: relevant documents present and verified Patient identity confirmed: verbally with patient Intake: groin.  Sedation: Patient sedated: no Patient cooperative: yes Complexity: simple Post-procedure assessment: foreign body removed   (including critical care time)  Medications Ordered in ED  Medications  sodium chloride 0.9 % bolus 1,000 mL (0 mLs Intravenous Stopped 11/16/16 0120)    And  0.9 %  sodium chloride infusion (not administered)  sodium chloride 0.9 % 1,000 mL with thiamine 500 mg, folic acid 1 mg, multivitamins adult 10 mL, magnesium sulfate 2 g infusion ( Intravenous New Bag/Given 11/16/16 0049)  doxycycline (VIBRA-TABS) tablet 100 mg (100 mg Oral Given 11/16/16 0354)  insulin aspart (novoLOG) injection 3 Units (3 Units Subcutaneous Given 11/16/16 0354)      3:25 AM Pt's sensorium has been cleared. He is asking for food and wants to go home and sleep in his own bed. He will be given antibiotics for his tick bite and will be discharged home.   Final Clinical Impressions(s) / ED Diagnoses  Return for shortness of breath, swelling of the lips, tongue or uvula, chest pain with exertion, shortness of breath with exertion, altered level of consciousness,bleeding or any concerns. Will start doxycycline due to tick exposure.  Follow up with your own doctor for recheck.  The  patient is nontoxic-appearing on exam and vital signs are within normal limits.   I have reviewed the triage vital signs and the nursing notes. Pertinent labs &imaging results that were available during my care of the patient were reviewed by me and considered in my medical decision  making (see chart for details).  After history, exam, and medical workup I feel the patient has been appropriately medically screened and is safe for discharge home. Pertinent diagnoses were discussed with the patient. Patient was given return precautions.    I personally performed the services described in this documentation, which was scribed in my presence. The recorded information has been reviewed and is accurate.     Raetta Agostinelli, MD 11/16/16 [phone]

## 2016-11-16 ENCOUNTER — Emergency Department (HOSPITAL_COMMUNITY)
Admission: EM | Admit: 2016-11-16 | Discharge: 2016-11-17 | Disposition: A | Discharge status: Home / Self Care | Payer: Medicare Other | Attending: Emergency Medicine | Admitting: Emergency Medicine

## 2016-11-16 ENCOUNTER — Emergency Department (HOSPITAL_COMMUNITY): Payer: Medicare Other

## 2016-11-16 DIAGNOSIS — F17210 Nicotine dependence, cigarettes, uncomplicated: Secondary | ICD-10-CM | POA: Diagnosis not present

## 2016-11-16 DIAGNOSIS — Z5900 Homelessness unspecified: Secondary | ICD-10-CM

## 2016-11-16 DIAGNOSIS — I10 Essential (primary) hypertension: Secondary | ICD-10-CM | POA: Diagnosis not present

## 2016-11-16 DIAGNOSIS — Z794 Long term (current) use of insulin: Secondary | ICD-10-CM | POA: Insufficient documentation

## 2016-11-16 DIAGNOSIS — R739 Hyperglycemia, unspecified: Secondary | ICD-10-CM | POA: Diagnosis present

## 2016-11-16 DIAGNOSIS — E1165 Type 2 diabetes mellitus with hyperglycemia: Secondary | ICD-10-CM | POA: Insufficient documentation

## 2016-11-16 LAB — COMPREHENSIVE METABOLIC PANEL
ALBUMIN: 3.3 g/dL — AB (ref 3.5–5.0)
ALK PHOS: 136 U/L — AB (ref 38–126)
ALT: 30 U/L (ref 17–63)
ANION GAP: 10 (ref 5–15)
AST: 27 U/L (ref 15–41)
BILIRUBIN TOTAL: 0.3 mg/dL (ref 0.3–1.2)
BUN: 23 mg/dL — ABNORMAL HIGH (ref 6–20)
CALCIUM: 8.5 mg/dL — AB (ref 8.9–10.3)
CO2: 23 mmol/L (ref 22–32)
Chloride: 103 mmol/L (ref 101–111)
Creatinine, Ser: 0.68 mg/dL (ref 0.61–1.24)
Glucose, Bld: 329 mg/dL — ABNORMAL HIGH (ref 65–99)
POTASSIUM: 3.9 mmol/L (ref 3.5–5.1)
Sodium: 136 mmol/L (ref 135–145)
TOTAL PROTEIN: 7.3 g/dL (ref 6.5–8.1)

## 2016-11-16 LAB — URINALYSIS, ROUTINE W REFLEX MICROSCOPIC
BACTERIA UA: NONE SEEN
BILIRUBIN URINE: NEGATIVE
Glucose, UA: >=500 mg/dL — AB
Hgb urine dipstick: NEGATIVE
KETONES UR: NEGATIVE mg/dL
LEUKOCYTES UA: NEGATIVE
NITRITE: NEGATIVE
Protein, ur: NEGATIVE mg/dL
RBC / HPF: NONE SEEN RBC/hpf (ref 0–5)
SQUAMOUS EPITHELIAL / LPF: NONE SEEN
Specific Gravity, Urine: 1.016 (ref 1.005–1.030)
WBC UA: NONE SEEN WBC/hpf (ref 0–5)
pH: 7 (ref 5.0–8.0)

## 2016-11-16 LAB — CBC WITH DIFFERENTIAL/PLATELET
Basophils Absolute: 0 10*3/uL (ref 0.0–0.1)
Basophils Relative: 0 %
EOS ABS: 0.1 10*3/uL (ref 0.0–0.7)
Eosinophils Relative: 1 %
HEMATOCRIT: 32.1 % — AB (ref 39.0–52.0)
HEMOGLOBIN: 11 g/dL — AB (ref 13.0–17.0)
LYMPHS ABS: 2.9 10*3/uL (ref 0.7–4.0)
LYMPHS PCT: 40 %
MCH: 31.7 pg (ref 26.0–34.0)
MCHC: 34.3 g/dL (ref 30.0–36.0)
MCV: 92.5 fL (ref 78.0–100.0)
MONOS PCT: 9 %
Monocytes Absolute: 0.7 10*3/uL (ref 0.1–1.0)
NEUTROS ABS: 3.7 10*3/uL (ref 1.7–7.7)
NEUTROS PCT: 50 %
Platelets: 268 10*3/uL (ref 150–400)
RBC: 3.47 MIL/uL — AB (ref 4.22–5.81)
RDW: 13.7 % (ref 11.5–15.5)
WBC: 7.4 10*3/uL (ref 4.0–10.5)

## 2016-11-16 LAB — RAPID URINE DRUG SCREEN, HOSP PERFORMED
AMPHETAMINES: NOT DETECTED
Barbiturates: NOT DETECTED
Benzodiazepines: NOT DETECTED
Cocaine: NOT DETECTED
OPIATES: NOT DETECTED
Tetrahydrocannabinol: NOT DETECTED

## 2016-11-16 LAB — I-STAT CHEM 8, ED
BUN: 32 mg/dL — AB (ref 6–20)
CALCIUM ION: 1.13 mmol/L — AB (ref 1.15–1.40)
CHLORIDE: 101 mmol/L (ref 101–111)
CREATININE: 0.6 mg/dL — AB (ref 0.61–1.24)
Glucose, Bld: 332 mg/dL — ABNORMAL HIGH (ref 65–99)
HCT: 33 % — ABNORMAL LOW (ref 39.0–52.0)
Hemoglobin: 11.2 g/dL — ABNORMAL LOW (ref 13.0–17.0)
Potassium: 4.3 mmol/L (ref 3.5–5.1)
SODIUM: 138 mmol/L (ref 135–145)
TCO2: 29 mmol/L (ref 0–100)

## 2016-11-16 LAB — ACETAMINOPHEN LEVEL

## 2016-11-16 LAB — SALICYLATE LEVEL

## 2016-11-16 LAB — ETHANOL

## 2016-11-16 LAB — CBG MONITORING, ED: Glucose-Capillary: 335 mg/dL — ABNORMAL HIGH (ref 65–99)

## 2016-11-16 MED ORDER — INSULIN ASPART 100 UNIT/ML ~~LOC~~ SOLN
3.0000 [IU] | Freq: Once | SUBCUTANEOUS | Status: AC
  Administered 2016-11-16: 3 [IU] via SUBCUTANEOUS
  Filled 2016-11-16: qty 1

## 2016-11-16 MED ORDER — DOXYCYCLINE HYCLATE 100 MG PO CAPS: 100.0000 mg | ORAL_CAPSULE | Freq: Two times a day (BID) | ORAL | 0 refills | Status: AC

## 2016-11-16 MED ORDER — THIAMINE HCL 100 MG/ML IJ SOLN
Freq: Once | INTRAVENOUS | Status: AC
  Administered 2016-11-16: 01:00:00 via INTRAVENOUS
  Filled 2016-11-16: qty 1000

## 2016-11-16 MED ORDER — VITAMIN B-1 100 MG PO TABS
500.0000 mg | ORAL_TABLET | ORAL | Status: AC
  Administered 2016-11-16: 500 mg via ORAL
  Filled 2016-11-16: qty 5

## 2016-11-16 MED ORDER — DOXYCYCLINE HYCLATE 100 MG PO TABS
100.0000 mg | ORAL_TABLET | Freq: Once | ORAL | Status: AC
  Administered 2016-11-16: 100 mg via ORAL
  Filled 2016-11-16: qty 1

## 2016-11-16 NOTE — ED Notes (Addendum)
Pt covered in urine and feces upon arrival; this Clinical research associatewriter and Katrina, NT provided bed bath. Tick noted to right side of patient genitalia; Palumbo, MD notified and tick removal in process.

## 2016-11-16 NOTE — ED Provider Notes (Signed)
[facility]-EMERGENCY DEPT Provider Note   CSN: [id number] Arrival date & time: 11/16/16  1911 By signing my name below, I, Sean Walsh, attest that this documentation has been prepared under the direction and in the presence of Geoffery Lyons, MD . Electronically Signed: Levon Walsh, Scribe. 11/16/2016. 11:39 PM.   History   Chief Complaint Chief Complaint  Patient presents with  . Alcohol Intoxication  . Homeless   HPI Sean Walsh is a 61 y.o. male with a history of substance abuse, HIV, and DM, brought in by ambulance, who presents to the Emergency Department for evaluation of EtOH intoxication and  Pt states he is currently homeless and wanted to find a place to sleep tonight. Per EMS, pt had a CBG of 485 tonight. The patient is currently on no regular medications, but states his CBG has been under control. Pt denies any alcohol consumption tonight. Pt was seen last night for EtOH intoxication where he had normal work-up and was given homeless resource guide by social work. Pt has no other acute complaints or associated symptoms at this time.    The history is provided by the patient. No language interpreter was used.   Past Medical History:  Diagnosis Date  . Diabetes mellitus without complication (HCC)   . Hypertension   . Neuromuscular disorder (HCC)   . Substance abuse     Patient Active Problem List   Diagnosis Date Noted  . 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 HTN (hypertension) 10/21/2016  . 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 Acute encephalopathy 10/19/2016  . Rhabdomyolysis 10/18/2016  . Facial fracture due to fall (HCC) 10/18/2016  . Hypokalemia 10/18/2016  . Substance abuse 10/18/2016  . Diabetes (HCC) 10/18/2016    Past Surgical History:  Procedure Laterality Date  . NO PAST SURGERIES       Home Medications    Prior to Admission medications   Medication Sig Start Date End Date Taking? Authorizing Provider  insulin aspart  (NOVOLOG) 100 UNIT/ML injection Inject 13 Units into the skin 3 (three) times daily with meals. 10/26/16  Yes Eulah Pont, MD  insulin glargine (LANTUS) 100 UNIT/ML injection Inject 0.2 mLs (20 Units total) into the skin at bedtime. 10/26/16  Yes Eulah Pont, MD  amLODipine (NORVASC) 5 MG tablet Take 1 tablet (5 mg total) by mouth daily. Patient not taking: Reported on 11/16/2016 10/27/16   Eulah Pont, MD  bacitracin ointment Apply topically 2 (two) times daily. Patient not taking: Reported on 11/16/2016 10/26/16   Eulah Pont, MD  doxycycline (VIBRAMYCIN) 100 MG capsule Take 1 capsule (100 mg total) by mouth 2 (two) times daily. One po bid x 7 days Patient not taking: Reported on 11/16/2016 11/16/16   Palumbo, April, MD  doxycycline (VIBRAMYCIN) 100 MG capsule Take 1 capsule (100 mg total) by mouth 2 (two) times daily. Patient not taking: Reported on 11/16/2016 11/16/16   Rolland Porter, MD  folic acid (FOLVITE) 1 MG tablet Take 1 tablet (1 mg total) by mouth daily. Patient not taking: Reported on 11/16/2016 10/26/16   Eulah Pont, MD  Multiple Vitamin (MULTIVITAMIN WITH MINERALS) TABS tablet Take 1 tablet by mouth daily. Patient not taking: Reported on 11/16/2016 10/27/16   Eulah Pont, MD  pantoprazole (PROTONIX) 40 MG tablet Take 1 tablet (40 mg total) by mouth daily. Patient not taking: Reported on 11/16/2016 10/27/16   Eulah Pont, MD  phosphorus (K PHOS NEUTRAL) [phone] MG tablet Take 2 tablets (500 mg total) by mouth  2 (two) times daily. Patient not taking: Reported on 11/16/2016 10/26/16   Eulah PontBlum, Nina, MD  sodium chloride (OCEAN) 0.65 % SOLN nasal spray Place 1 spray into both nostrils as needed for congestion. Patient not taking: Reported on 11/16/2016 10/26/16   Eulah PontBlum, Nina, MD  thiamine 100 MG tablet Take 1 tablet (100 mg total) by mouth daily. Patient not taking: Reported on 11/16/2016 11/12/16   Eulah PontBlum, Nina, MD    Family History No family history on file.  Social History Social History  Substance Use Topics  .  Smoking status: Smoker, Current Status Unknown    Packs/day: 2.00    Years: 10.00    Types: Cigarettes  . Smokeless tobacco: Current User  . Alcohol use Not on file     Comment: unknown     Allergies   Patient has no known allergies.   Review of Systems Review of Systems All systems reviewed and are negative for acute change except as noted in the HPI.  Physical Exam Updated Vital Signs BP (!) 165/85 (BP Location: Right Arm)   Pulse 91   Temp 98.2 F (36.8 C) (Oral)   Resp 16   Ht 5\' 6"  (1.676 m)   Wt 122 lb (55.3 kg)   SpO2 95%   BMI 19.69 kg/m   Physical Exam  Constitutional: He is oriented to person, place, and time. He appears well-developed and well-nourished.  Pt appears thin and disheveled.   HENT:  Head: Normocephalic and atraumatic.  Mouth/Throat: Oropharynx is clear and moist.  Eyes: EOM are normal.  Neck: Normal range of motion.  Cardiovascular: Normal rate, regular rhythm, normal heart sounds and intact distal pulses.   Pulmonary/Chest: Effort normal and breath sounds normal. No respiratory distress.  Abdominal: Soft. He exhibits no distension. There is no tenderness.  Musculoskeletal: Normal range of motion.  Neurological: He is alert and oriented to person, place, and time.  Skin: Skin is warm and dry.  Psychiatric: He has a normal mood and affect. Judgment normal.  Nursing note and vitals reviewed.  ED Treatments / Results  DIAGNOSTIC STUDIES:  Oxygen Saturation is 95% on RA, adequate by my interpretation.    COORDINATION OF CARE:  11:28 PM Discussed treatment plan with pt at bedside and pt agreed to plan.  Labs (all labs ordered are listed, but only abnormal results are displayed) Labs Reviewed  BASIC METABOLIC PANEL - Abnormal; Notable for the following:       Result Value   Sodium 134 (*)    Chloride 100 (*)    Glucose, Bld 446 (*)    All other components within normal limits  ETHANOL    EKG  EKG Interpretation None        Radiology Dg Chest Port 1 View  Result Date: 11/16/2016 CLINICAL DATA:  Found outside with altered mental status. EXAM: PORTABLE CHEST 1 VIEW COMPARISON:  11/04/2016 FINDINGS: The cardiomediastinal contours are normal. Again seen thoracic aortic atherosclerosis. Streaky bibasilar atelectasis. Pulmonary vasculature is normal. No consolidation, pleural effusion, or pneumothorax. No acute osseous abnormalities are seen. IMPRESSION: Streaky bibasilar atelectasis. Electronically Signed   By: Rubye OaksMelanie  Ehinger M.D.   On: 11/16/2016 00:50    Procedures Procedures (including critical care time)  Medications Ordered in ED Medications  insulin aspart (novoLOG) injection 10 Units (not administered)     Initial Impression / Assessment and Plan / ED Course  I have reviewed the triage vital signs and the nursing notes.  Pertinent labs & imaging results that were  available during my care of the patient were reviewed by me and considered in my medical decision making (see chart for details).  This patient is a 61 year old male who presents for evaluation of elevated blood sugar. He has a history of noncompliant diabetes, alcoholism, and is homeless. He informed the nurse he was here because he needed something to eat and somewhere to rest. His initial blood sugar was elevated, however improved after receiving insulin in the ER. He was allowed to stay in the emergency department, but will be discharged to the homeless shelter in the morning.  He was just in the emergency department yesterday with similar issues and discharged. He was given case management consultation and received information for various homeless resources in the area. I see no reason to reconsult case management as they have evaluated with him and made recommendations within the last 24 hours.  Final Clinical Impressions(s) / ED Diagnoses   Final diagnoses:  None    New Prescriptions New Prescriptions   No medications on file    I personally performed the services described in this documentation, which was scribed in my presence. The recorded information has been reviewed and is accurate.       Geoffery Lyons, MD 11/17/16 [phone]

## 2016-11-16 NOTE — ED Notes (Signed)
Bed: [id number] Expected date:  Expected time:  Means of arrival:  Comments: Hold for [employer]

## 2016-11-16 NOTE — ED Notes (Signed)
Pt continuously yelling out at staff in the waiting area. Pt wanting to smoke in the lobby and told he can not. Pt walking around in the lobby and stating he is a fall risk.

## 2016-11-16 NOTE — ED Notes (Signed)
Returned pt's belongings that were left during prior visit.

## 2016-11-16 NOTE — ED Triage Notes (Signed)
Pt stated "I just need a place to sleep.  I guess I'll call my dad in the morning.  [place] [facility] [place].  That's where my dad & mom work at."

## 2016-11-16 NOTE — Progress Notes (Signed)
CSW consulted regarding homeless resources for pt. Pt informed CSW that pt is currently homeless. Pt mentioned that pt has family in [place] (Mom, dad, and other family members) and would like to go there, but was informed that this may not be an option due to transportation. CSW provided pt with homeless resources and described to pt which one would help pt with. Pt was also given bus pass to get pt to homeless shelter of pt's choice.    CSW updated pt's doctor and nurse of the resources given tot pt and no further concerns were presented at this time.       Claude MangesKierra S. Sye Schroepfer, MSW, LCSW-A Emergency Department Clinical Social Worker [phone]

## 2016-11-16 NOTE — ED Triage Notes (Signed)
Pt presents via EMS with ETOH intoxication and hyperglycemia. CBG was 485 for EMS. Pt denies drinking anything but smells of ETOH, vomit, and urine.

## 2016-11-16 NOTE — Discharge Instructions (Addendum)
Take antibiotic as prescribed. Avoid excess sun, as the antibiotic can make you sun-sensitive.

## 2016-11-16 NOTE — ED Notes (Signed)
Patient currently eating breakfast 

## 2016-11-16 NOTE — ED Notes (Signed)
Unsuccessful blood draw to right forearm and right AC.

## 2016-11-16 NOTE — ED Notes (Signed)
Pt directed into shower d/t incontinent of stool.

## 2016-11-16 NOTE — ED Notes (Signed)
Pt moving in bed with VS reassessment.

## 2016-11-16 NOTE — ED Notes (Signed)
Pt stated "[facility] but [facility] out.  I just want a hot biscuit and hot blanket.  I just want to eat my biscuit and go to bed.  [place]

## 2016-11-16 NOTE — ED Notes (Signed)
Social work at bedside.  

## 2016-11-16 NOTE — ED Notes (Signed)
Pt not in lobby at this time.

## 2016-11-16 NOTE — ED Notes (Addendum)
Pt alert, awake, responding to conversation appropriately per his normal, and walking with steady gait. Pt given ham sandwich. [facility] to [facility] bus pass per [place]. Plan to discharge pt with bus pass in the morning when light outside per this Clinical research associatewriter and Misty StanleyLisa, Consulting civil engineercharge RN.   Discharge instructions reviewed with pt; departure condition need reassessed prior to pt leaving in the am.  Pt mother called per pt request when discharge instructions reviewed. Mother concerned pt not in right mental state to leave by himself. Palumbo, MD given mother's phone number to answer/address questions.

## 2016-11-17 DIAGNOSIS — E1165 Type 2 diabetes mellitus with hyperglycemia: Secondary | ICD-10-CM | POA: Diagnosis not present

## 2016-11-17 LAB — ETHANOL

## 2016-11-17 LAB — BASIC METABOLIC PANEL
Anion gap: 8 (ref 5–15)
BUN: 17 mg/dL (ref 6–20)
CALCIUM: 9.3 mg/dL (ref 8.9–10.3)
CO2: 26 mmol/L (ref 22–32)
CREATININE: 0.78 mg/dL (ref 0.61–1.24)
Chloride: 100 mmol/L — ABNORMAL LOW (ref 101–111)
Glucose, Bld: 446 mg/dL — ABNORMAL HIGH (ref 65–99)
Potassium: 4.4 mmol/L (ref 3.5–5.1)
Sodium: 134 mmol/L — ABNORMAL LOW (ref 135–145)

## 2016-11-17 LAB — CBG MONITORING, ED: Glucose-Capillary: 166 mg/dL — ABNORMAL HIGH (ref 65–99)

## 2016-11-17 MED ORDER — INSULIN ASPART 100 UNIT/ML ~~LOC~~ SOLN
10.0000 [IU] | Freq: Once | SUBCUTANEOUS | Status: AC
  Administered 2016-11-17: 10 [IU] via SUBCUTANEOUS
  Filled 2016-11-17: qty 1

## 2016-11-17 NOTE — ED Notes (Signed)
Pt stating "I'm just here to get some sleep.  I want some coffee in the morning.  Can you feed me?  I just want to cover up and get some sleep.  What time will breakfast be around?"

## 2016-11-17 NOTE — ED Notes (Signed)
Pt was provided with [facility] CS[facility], Bryan's # to call for assistance as provided in June D/C documents.  Pt provided a bus pass, Security to direct pt to bus stop.

## 2016-11-17 NOTE — ED Notes (Signed)
Pt OOB and urinating on floor.  Pt directed to BR.  Pt given clean dry scrubs.

## 2017-03-18 ENCOUNTER — Inpatient Hospital Stay: Admit: 2017-03-18 | Discharge: 2017-03-18

## 2017-03-18 ENCOUNTER — Emergency Department: Admit: 2017-03-18 | Discharge: 2017-03-18

## 2017-03-18 DIAGNOSIS — F1490 Cocaine use, unspecified, uncomplicated: Secondary | ICD-10-CM

## 2017-03-18 DIAGNOSIS — R072 Precordial pain: Secondary | ICD-10-CM

## 2017-03-18 DIAGNOSIS — R05 Cough: Secondary | ICD-10-CM

## 2017-03-18 DIAGNOSIS — R0602 Shortness of breath: Secondary | ICD-10-CM

## 2017-03-18 DIAGNOSIS — J069 Acute upper respiratory infection, unspecified: Principal | ICD-10-CM

## 2017-03-18 DIAGNOSIS — F17200 Nicotine dependence, unspecified, uncomplicated: Secondary | ICD-10-CM

## 2017-03-18 DIAGNOSIS — E119 Type 2 diabetes mellitus without complications: Secondary | ICD-10-CM

## 2017-03-18 DIAGNOSIS — Z880 Allergy status to penicillin: Secondary | ICD-10-CM

## 2017-03-18 DIAGNOSIS — R079 Chest pain, unspecified: Secondary | ICD-10-CM

## 2017-03-18 MED ORDER — BOLUS IV FLUID JX
Freq: Once | INTRAVENOUS | Status: CP
Start: 2017-03-18 — End: ?

## 2017-03-18 MED ORDER — IBUPROFEN 600 MG PO TABS
600 mg | Freq: Once | ORAL | Status: CP
Start: 2017-03-18 — End: ?

## 2017-03-18 NOTE — ED Notes
Pt given breakfast tray.

## 2017-03-18 NOTE — ED Provider Notes
History     Chief Complaint   Patient presents with   ? Chest Pain   ? Shortness of Breath       Nathaniel Haynes is a 61 y.o. male  has a past medical history of Diabetes mellitus (CMS-HCC code)., coming in for Chest Pain and Shortness of Breath.  Patient states that               Allergies   Allergen Reactions   ? Pcn [Penicillins] Anaphylaxis       Patient's Medications    No medications on file       Past Medical History:   Diagnosis Date   ? Diabetes mellitus (CMS-HCC code)        History reviewed. No pertinent surgical history.    History reviewed. No pertinent family history.    Social History     Social History   ? Marital status: N/A     Spouse name: N/A   ? Number of children: N/A   ? Years of education: N/A     Social History Main Topics   ? Smoking status: Current Some Day Smoker   ? Smokeless tobacco: Never Used   ? Alcohol use Yes      Comment: occasionally   ? Drug use: Yes     Types: Marijuana   ? Sexual activity: Not Asked     Other Topics Concern   ? None     Social History Narrative   ? None       Review of Systems    Physical Exam     ED Triage Vitals [03/18/17 0631]   BP 151/86   Pulse 94   Resp 16   Temp 36.8 ?C (98.3 ?F)   Temp src Oral   Height 1.753 m   Weight 63.5 kg   SpO2 98 %   BMI (Calculated) 20.72             Physical Exam    Differential DDx: ***    Is this an Emergent Medical Condition? {SH ED EMERGENT MEDICAL [phone]}  409.901 FS  641.19 FS  627.732 (16) FS    ED Workup   Procedures    Labs:  -   POCT GLUCOSE - Abnormal        Result Value Ref Range    Glucose (Meter) 377 (*) 60.0 - 99.0 mg/dL   POCT GLUCOSE         Imaging (Read by ED Provider):  {Imaging findings:[phone]}      EKG (Read by ED Provider):  {EKG [phone]}        ED Course & Re-Evaluation          MDM   Decide to obtain history from someone other than the patient: {SH ED Lamonte SakaiJX MDM - OBTAIN ZOXWRUE:45409}HISTORY:28378}    Decide to obtain previous medical records: [facility]{SH ED Lamonte SakaiJX MDM - PREVIOUS MED

## 2017-03-18 NOTE — ED Notes
IV removed with catheter intact.  Pressure applied, Time of discharge: 11:39  AM., Patient discharged to  Other Facility.  Patient discharged  ambulatory. to exit with belongings in  Stable condition.  Patient escorted by  no one., Written discharge instructions given to  patient.  Patient/recipient  verbalizes discharge instructions. Pt A&Ox4, NAD, gait steady. Pt has all personal belongings accounted for. Pt states he has no way of getting home and needs a taxi to a rehab center. CM notified.

## 2017-03-18 NOTE — ED Notes
Pt left unit with all belongings

## 2017-03-18 NOTE — ED Provider Notes
Patient Status:   Good        ED Medical Evaluation Initiated   Medical Evaluation Initiated:  Yes, filed at 03/18/17 [id number]  by Jannette Spannerlugston, Cory, MD            Janora NorlanderJiang, Robert, MD  Resident  03/18/17 [phone]

## 2017-03-18 NOTE — ED Triage Notes
Pt ambulates to triage with even and steady gait with c/o sharp mid CP with bilateral arm numbness x 2 days. VSS. Skin WDI. RR even and unlabored. Pt also reports threee episodes of vomiting. Pt to ECC for eval.

## 2017-03-18 NOTE — ED Notes
Pt resting comfortably in stretcher with NAD noted at this time. Pt A&Ox4. Pt states CP is sharp/shooting/radiating. S1/S2 sounds, NSR. Pt states he has been living "on the streets for a little over a month." Has been using crack/cocaine daily. Wants to get clean. Educated pt on use of daily drugs and the effects on the heart. Pt states he understands education. [facility]. Pt has all personal belongings accounted for in duffle bag. Will continue to monitor.

## 2017-03-18 NOTE — ED Notes
Gave report to Brianna, RN

## 2017-03-18 NOTE — ED Provider Notes
REC - NO ZOX:09604}YES:28380}    Clinical Lab Test(s): {SH ED Lamonte SakaiJX MDM ORDERED AND REVIEWED:28124}    Diagnostic Tests (Radiology, EKG): {SH ED Lamonte SakaiJX MDM ORDERED AND REVIEWED:28124}    Independent Visualization (ED US, Wet Prep, Other): {SH ED Lamonte SakaiJX MDM NO YES [id number]:14782}WILDCARD:26444}    Discussed patient with NON-ED Provider: {SH ED Lamonte SakaiJX MDM - ANOTHER PROVIDER:28381}      ED Disposition   ED Disposition: No ED Disposition Set      ED Clinical Impression   ED Clinical Impression:   Chest pain, unspecified type      ED Patient Status   Patient Status:   {SH [facility]:[phone]}        ED Medical Evaluation Initiated   Medical Evaluation Initiated:  Yes, filed at 03/18/17 [id number]  by Jannette Spannerlugston, Cory, MD

## 2017-03-18 NOTE — ED Provider Notes
Gastrointestinal: Positive for diarrhea. Negative for nausea, vomiting, abdominal pain and constipation.   Genitourinary: Negative for dysuria.   Musculoskeletal: Positive for myalgias. Negative for joint swelling and arthralgias.   Skin: Negative for rash.   Neurological: Negative for numbness and headaches.       Physical Exam     ED Triage Vitals [03/18/17 0631]   BP 151/86   Pulse 94   Resp 16   Temp 36.8 ?C (98.3 ?F)   Temp src Oral   Height 1.753 m   Weight 63.5 kg   SpO2 98 %   BMI (Calculated) 20.72             Physical Exam   Constitutional: He is oriented to person, place, and time. He appears well-developed.   HENT:   Head: Normocephalic and atraumatic.   Right Ear: External ear normal.   Left Ear: External ear normal.   Eyes: Pupils are equal, round, and reactive to light. Conjunctivae and EOM are normal. Right eye exhibits no discharge. Left eye exhibits no discharge. No scleral icterus.   Neck: Normal range of motion.   Cardiovascular: Normal rate, regular rhythm, normal heart sounds and intact distal pulses.  Exam reveals no gallop and no friction rub.    No murmur heard.  Pulmonary/Chest: Effort normal and breath sounds normal. No respiratory distress. He has no wheezes. He has no rales. He exhibits no tenderness.   One rhonchorous breath sound, cleared after coughing.     Reproducible midsteranl point tenderness.   Abdominal: Soft. Bowel sounds are normal. He exhibits no distension and no mass. There is no tenderness. There is no rebound and no guarding.   Musculoskeletal: Normal range of motion.   Neurological: He is alert and oriented to person, place, and time. No cranial nerve deficit.   Skin: Skin is warm and dry. He is not diaphoretic.   Nursing note and vitals reviewed.      Differential DDx: MSK pain, Fracture, ACS, pneumonia, and others.    Is this an Emergent Medical Condition? Yes - Severe Pain/Acute Onset of Symptons  409.901 FS  641.19 FS  627.732 (16) FS    ED Workup   Procedures

## 2017-03-18 NOTE — ED Provider Notes
History     Chief Complaint   Patient presents with   ? Chest Pain   ? Shortness of Breath       Nathaniel Haynes is a 61 y.o. male  has a past medical history of Diabetes mellitus (CMS-HCC code)., coming in for Chest Pain and Shortness of Breath.  Patient states that intermittent midsternal chest pain, worse with palpation, and sob x 2 week. Pt also complains of aches and pains diffusely around his body. + cough, + Diarrhea.      The history is provided by the patient.   Chest Pain   Pain location:  Substernal area  Pain quality: aching    Pain radiates to:  Does not radiate  Pain severity:  Moderate  Onset quality:  Sudden  Timing:  Constant  Associated symptoms: shortness of breath    Associated symptoms: no abdominal pain, no diaphoresis, no fever, no headache, no nausea, no numbness, no palpitations and no vomiting        Allergies   Allergen Reactions   ? Pcn [Penicillins] Anaphylaxis       There are no discharge medications for this patient.      Past Medical History:   Diagnosis Date   ? Diabetes mellitus (CMS-HCC code)        History reviewed. No pertinent surgical history.    History reviewed. No pertinent family history.    Social History     Social History   ? Marital status: Single     Spouse name: N/A   ? Number of children: N/A   ? Years of education: N/A     Social History Main Topics   ? Smoking status: Current Some Day Smoker   ? Smokeless tobacco: Never Used   ? Alcohol use Yes      Comment: occasionally   ? Drug use: Yes     Types: Marijuana, Cocaine      Comment: crack/cocaine use daily   ? Sexual activity: Not Asked     Other Topics Concern   ? None     Social History Narrative   ? None       Review of Systems   Constitutional: Negative for fever, chills, diaphoresis, activity change, appetite change and unexpected weight change.   Respiratory: Positive for shortness of breath. Negative for wheezing.    Cardiovascular: Positive for chest pain. Negative for palpitations.

## 2017-03-18 NOTE — ED Provider Notes
Labs:  -   BASIC METABOLIC PANEL - Abnormal        Result Value Ref Range    Sodium 132 (*) 135 - 145 mmol/L    Potassium 4.1  3.3 - 4.6 mmol/L    Chloride 93 (*) 101 - 110 mmol/L    CO2 22  21 - 29 mmol/L    Urea Nitrogen 18  6 - 22 mg/dL    Creatinine 0.64 (*) 0.67 - 1.17 mg/dL    BUN/Creatinine Ratio 28.1 (*) 6.0 - 22.0 (calc)    Glucose 322 (*) 71 - 99 mg/dL    Calcium 9.3  8.6 - 10.0 mg/dL    Osmolality Calc 278.8      Anion Gap 17 (*) 4 - 16 mmol/L    EGFR >59  mL/min/1.73M2    Comment:   Reference range: =>90 ml/min/1.73M2  eGFR estimates are unable to accurately differentiate levels of GFR above 60 ml/min/1.73M2.   CBC AUTODIFF - Abnormal     WBC 10.50  4.5 - 11 x10E3/uL    RBC 4.15 (*) 4.50 - 6.30 x10E6/uL    Hemoglobin 12.4 (*) 14.0 - 18.0 g/dL    Hematocrit 36.2 (*) 40.0 - 54.0 %    MCV 87.2  82.0 - 101.0 fl    MCH 29.9  27.0 - 34.0 pg    MCHC 34.3  31.0 - 36.0 g/dL    RDW 13.1  12.0 - 16.1 %    Platelet Count 182  140 - 440 thou/cu mm    MPV 10.4  9.5 - 11.5 fl    nRBC % 0.0  0.0 - 1.0 %    Absolute NRBC Count 0.00      Neutrophils % 69.9  34.0 - 73.0 %    Lymphocytes % 22.1 (*) 25.0 - 45.0 %    Monocytes % 6.8 (*) 2.0 - 6.0 %    Eosinophils % 0.6 (*) 1.0 - 4.0 %    Immature Granulocytes % 0.3  0.0 - 2.0 %    Neutrophils Absolute 7.35  1.80 - 8.70 x10E3/uL    Lymphocytes Absolute 2.32  x10E3/uL    Monocytes Absolute 0.71  x10E3/uL    Eosinophils Absolute 0.06  x10E3/uL    Basophil Absolute 0.03  x10E3/uL    Absolute Immature Granulocytes 0.03 (*) 0 - 0 x10E3/uL    Basophils % 0.3  0 - 1 %   POCT GLUCOSE - Abnormal     Glucose (Meter) 377 (*) 60.0 - 99.0 mg/dL   POCT GLUCOSE - Abnormal     Glucose (Meter) 323 (*) 60.0 - 99.0 mg/dL   POCT TROPININ I - Normal    Troponin I (Point of Care) <0.05  0.00 - 0.23 ng/mL   POCT TROPININ I - Normal    Troponin I (Point of Care) <0.05  0.00 - 0.23 ng/mL   POCT TROPININ I - Normal    Troponin I (Point of Care) <0.05  0.00 - 0.23 ng/mL   POCT GLUCOSE

## 2017-03-18 NOTE — ED Provider Notes
POCT TROPININ I   POCT TROPININ I   POCT TROPININ I   CBC AND DIFFERENTIAL         Imaging (Read by ED Provider):  Per Radiology:  Xr Chest Single View    Result Date: 03/18/2017  No acute cardiopulmonary process.         EKG (Read by ED Provider):  Sinus rhythm, PR: 168 QTc: 479  Janora Norlanderobert Jiang, MD 7:04 AM 03/18/2017  Unchanged from prior PR: 170 QTc: 481  Janora Norlanderobert Jiang, MD 12:08 PM 03/18/2017        ED Course & Re-Evaluation     ED Course as of Mar 18 1214   Wynelle LinkSun Mar 18, 2017   0704 Sinus rhythm, PR: 168 QTc: 479  Janora Norlanderobert Jiang, MD 7:04 AM 03/18/2017    [RJ]   [phone] 61 yo M with CP x 2 weeks, intermittent, only medical problem is diabetes. Pt states pain is midsternal and reproducible. Pt denies cough, sob. B/l shoulder "numbness"  [RJ]   0746 HEART score:    History (0-2) +1: Moderately Suspicious  EKG  +0: Normal  Age +1: 45-65  Risk Factors Diabetes Mellitus and Cigarette smoking   +1: 1-2 risk factors   Troponin  +0: <normal limit  _________________________________________________________  Total:  3 :0-3 = Low Risk (0.9-1.7%)    [RJ]   0828 Pt with fever, re-evaluated pt, pt now states + Cough, +diarrhea.  [RJ]   [id number] Delta troponin due at 11:00  [RJ]      ED Course User Index  [RJ] Janora NorlanderJiang, Robert, MD   Delta troponin negative. Pt sleeping comfortably. Pt with chest pain that is associated with URI symptoms. Pt given toradol with improvement of symptoms.  Told to take Ibuprofen for myalgia control, followup with PCP.    MDM   Decide to obtain history from someone other than the patient: No    Decide to obtain previous medical records: No    Clinical Lab Test(s): Ordered and Reviewed    Diagnostic Tests (Radiology, EKG): Ordered and Reviewed    Independent Visualization (ED US, Wet Prep, Other): No    Discussed patient with NON-ED Provider: None      ED Disposition   ED Disposition: Discharge      ED Clinical Impression   ED Clinical Impression:   Chest pain, unspecified type  Cough  URI, acute      ED Patient Status

## 2017-08-16 ENCOUNTER — Emergency Department: Admit: 2017-08-16 | Discharge: 2017-08-17

## 2017-08-16 ENCOUNTER — Inpatient Hospital Stay: Admit: 2017-08-16 | Discharge: 2017-08-17

## 2017-08-16 DIAGNOSIS — R109 Unspecified abdominal pain: Secondary | ICD-10-CM

## 2017-08-16 DIAGNOSIS — E119 Type 2 diabetes mellitus without complications: Secondary | ICD-10-CM

## 2017-08-16 DIAGNOSIS — F17200 Nicotine dependence, unspecified, uncomplicated: Secondary | ICD-10-CM

## 2017-08-16 DIAGNOSIS — R091 Pleurisy: Secondary | ICD-10-CM

## 2017-08-16 DIAGNOSIS — R079 Chest pain, unspecified: Principal | ICD-10-CM

## 2017-08-16 DIAGNOSIS — Z794 Long term (current) use of insulin: Secondary | ICD-10-CM

## 2017-08-16 DIAGNOSIS — Z79899 Other long term (current) drug therapy: Secondary | ICD-10-CM

## 2017-08-16 DIAGNOSIS — Z880 Allergy status to penicillin: Secondary | ICD-10-CM

## 2017-08-16 DIAGNOSIS — F1010 Alcohol abuse, uncomplicated: Secondary | ICD-10-CM

## 2017-08-16 MED ORDER — ONDANSETRON 4 MG PO TBDP
4 mg | Freq: Once | ORAL | Status: DC
Start: 2017-08-16 — End: 2017-08-17

## 2017-08-16 MED ORDER — INSULIN ASPART 100 UNIT/ML SC SOLN
Freq: Three times a day (TID) | SUBCUTANEOUS
Start: 2017-08-16 — End: ?

## 2017-08-16 MED ORDER — MAALOX/HYOSCYAMINE UNIT DOSE JX
Freq: Once | ORAL | Status: DC
Start: 2017-08-16 — End: 2017-08-17

## 2017-08-16 MED ORDER — GABAPENTIN EX
CUTANEOUS
Start: 2017-08-16 — End: ?

## 2017-08-16 MED ORDER — BOLUS IV FLUID JX
Freq: Once | INTRAVENOUS | Status: CP
Start: 2017-08-16 — End: ?

## 2017-08-16 NOTE — ED Triage Notes
Pt is a 62 y.o. BM arrives to ED via [facility] for burning CP, SOB, abdominal pain x "weeks". Pt sts I always have the pain, unable to tell when it started. Hx of DM. Pt drooling in strecher, new for patient. PT has slurred speech. Denies alcohol use today. Admits to smoking weed today. PT is a/ox3. RR appear even and unlabored. 100% on RA. PT speaking in full, clear sentences. PT to ecc for further eval.

## 2017-08-17 NOTE — ED Provider Notes
History     Chief Complaint   Patient presents with   ? Chest Pain   ? Abdominal Pain       62 yo M with DM p/w etoh abuse and cp. Pt endorses substernal Cp "all day", worse with deep breathing, nonradiating. Unable to specify modifying factors  Denies sob, abd pain, n/v. Endorses etoh use and would like food bc he is hungry. Denies falls. Not on blood thinners.   +tobacco use        Chest Pain   Pain location:  Substernal area  Pain radiates to:  Does not radiate  Context: breathing    Worsened by:  Deep breathing  Associated symptoms: no abdominal pain, no cough, no fever, no nausea, no shortness of breath and no vomiting    Risk factors: diabetes mellitus and male sex        Allergies   Allergen Reactions   ? Pcn [Penicillins] Anaphylaxis       Patient's Medications   New Prescriptions    No medications on file   Previous Medications    GABAPENTIN EX    topically.    INSULIN ASPART (NOVOLOG) VIAL    Inject into the skin 3 times daily (before meals).   Modified Medications    No medications on file   Discontinued Medications    No medications on file       Past Medical History:   Diagnosis Date   ? Diabetes mellitus (CMS-HCC: 19)        History reviewed. No pertinent surgical history.    No family history on file.    Social History     Social History   ? Marital status: Single     Spouse name: N/A   ? Number of children: N/A   ? Years of education: N/A     Social History Main Topics   ? Smoking status: Current Some Day Smoker   ? Smokeless tobacco: Never Used   ? Alcohol use Yes      Comment: occasionally   ? Drug use: Yes     Types: Marijuana, Cocaine      Comment: crack/cocaine use daily   ? Sexual activity: Not Asked     Other Topics Concern   ? None     Social History Narrative   ? None       Review of Systems   Constitutional: Negative for fever and chills.   Respiratory: Negative for cough and shortness of breath.    Cardiovascular: Positive for chest pain. Negative for leg swelling.

## 2017-08-17 NOTE — ED Provider Notes
History     Chief Complaint   Patient presents with   ? Chest Pain   ? Abdominal Pain       62 yo M with DM p/w etoh abuse and cp. Pt endorses substernal Cp "all day", worse with deep breathing, nonradiating.   Denies sob, abd pain, n/v. Endorses etoh use and would like food bc he is hungry. Denies falls. Not on blood thinners.   +tobacco use            Allergies   Allergen Reactions   ? Pcn [Penicillins] Anaphylaxis       Patient's Medications   New Prescriptions    No medications on file   Previous Medications    GABAPENTIN EX    topically.    INSULIN ASPART (NOVOLOG) VIAL    Inject into the skin 3 times daily (before meals).   Modified Medications    No medications on file   Discontinued Medications    No medications on file       Past Medical History:   Diagnosis Date   ? Diabetes mellitus (CMS-HCC: 19)        History reviewed. No pertinent surgical history.    No family history on file.    Social History     Social History   ? Marital status: Single     Spouse name: N/A   ? Number of children: N/A   ? Years of education: N/A     Social History Main Topics   ? Smoking status: Current Some Day Smoker   ? Smokeless tobacco: Never Used   ? Alcohol use Yes      Comment: occasionally   ? Drug use: Yes     Types: Marijuana, Cocaine      Comment: crack/cocaine use daily   ? Sexual activity: Not Asked     Other Topics Concern   ? None     Social History Narrative   ? None       Review of Systems    Physical Exam     ED Triage Vitals [08/16/17 1914]   BP 130/75   Pulse 100   Resp 18   Temp 36.8 ?C (98.3 ?F)   Temp src Oral   Height 1.651 m   Weight 61.2 kg   SpO2 100 %   BMI (Calculated) 22.51             Physical Exam   Constitutional: He is oriented to person, place, and time. He appears well-developed and well-nourished.   Aox3. Speaking in full sentences. Airway intact   HENT:   Head: Normocephalic and atraumatic.   Right Ear: External ear normal.   Left Ear: External ear normal.   Eyes: Conjunctivae are normal.

## 2017-08-17 NOTE — ED Provider Notes
Glucose (Meter) 396 (*) 60.0 - 99.0 mg/dL   POCT GLUCOSE - Abnormal     Glucose (Meter) 267 (*) 60.0 - 99.0 mg/dL   POCT URINALYSIS AUTO W/O MICROSCOPY - Abnormal     Color -Ur Yellow      Clarity, UA Clear      Spec Grav 1.015  1.003 - 1.030    pH 5.5  4.5 - 8.0    Urobilinogen -Ur 0.2  <=2.0 E.U./dL    Nitrite -Ur Negative  Negative    Protein-UA 30  (*) Negative mg/dL    Glucose -Ur 540500  (*) Negative mg/dL    Blood, UA Trace (*) Negative    WBC, UA Negative  Negative    Bilirubin -Ur Negative  Negative    Ketones -UR Negative  Negative   MAGNESIUM - Normal    Magnesium 2.0  1.8 - 2.6 mg/dL   PHOSPHORUS - Normal    Phosphorus,Inorganic 4.2  2.5 - 4.5 mg/dL   BETA HYDROXYBUTYRATE - Normal    B-Hydroxybutyrate 2.0  0.2 - 2.8 mg/dL   HEPATIC FUNCTION PANEL    Albumin 4.4  3.8 - 4.9 g/dL    Total Bilirubin 0.8  0.2 - 1.0 mg/dL    Bilirubin, Direct 0.1  0.0 - 0.2 mg/dL    Bilirubin, Indirect 0.7  8mg /dL mg/dL    Alkaline Phosphatase 102  40 - 129 IU/L    AST 31  14 - 33 IU/L    ALT 28  10 - 42 IU/L    Total Protein 8.0  6.5 - 8.3 g/dL    ALBUMIN/GLOBULIN RATIO 1.2  (calc)    Calc Total Globuin 3.6  g/dL   BLOOD GAS VENOUS W/LYTES   TROPONIN T (ED -ONLY)   POCT GLUCOSE   POCT URINALYSIS AUTO W/O MICROSCOPY   POCT TROPININ I   CBC AND DIFFERENTIAL         Imaging (Read by ED Provider):  Per Radiology:  Xr Chest Single View    Result Date: 08/16/2017  No acute cardiopulmonary process.         EKG (Read by ED Provider):  Unchanged, no stemi        ED Course & Re-Evaluation     ED Course as of Aug 16 2148   Thu Aug 16, 2017   2124 Pt clinically intoxicated, etoh level 116. Pulled out his iv. Aox3. Pending labwork, trop added on to labs. Lipase slightly elevated however not clinically significant  [SD]   2137 Osmolal gap 16  [SD]      ED Course User Index  [SD] Zola Buttoneopujari, Shivani, MD         MDM   Decide to obtain history from someone other than the patient: No    Decide to obtain previous medical records: No

## 2017-08-17 NOTE — ED Provider Notes
History     Chief Complaint   Patient presents with   ? Chest Pain   ? Abdominal Pain       62 yo M with DM p/w etoh abuse and cp. Pt endorses substernal Cp "all day", worse with deep breathing, nonradiating. Unable to specify modifying factors  Denies sob, abd pain, n/v. Endorses etoh use and would like food bc he is hungry. Denies falls. Not on blood thinners.   +tobacco use        Chest Pain   Pain location:  Substernal area  Pain radiates to:  Does not radiate  Context: breathing    Worsened by:  Deep breathing  Associated symptoms: no abdominal pain, no cough, no fever, no nausea, no shortness of breath and no vomiting    Risk factors: diabetes mellitus and male sex        Allergies   Allergen Reactions   ? Pcn [Penicillins] Anaphylaxis       Discharge Medication List as of 08/16/2017 10:50 PM      CONTINUE these medications which have NOT CHANGED    Details   GABAPENTIN EX topically.Historical Med      insulin aspart (NovoLOG) vial Inject into the skin 3 times daily (before meals).Historical Med             Past Medical History:   Diagnosis Date   ? Diabetes mellitus (CMS-HCC: 19)        History reviewed. No pertinent surgical history.    No family history on file.    Social History     Social History   ? Marital status: Single     Spouse name: N/A   ? Number of children: N/A   ? Years of education: N/A     Social History Main Topics   ? Smoking status: Current Some Day Smoker   ? Smokeless tobacco: Never Used   ? Alcohol use Yes      Comment: occasionally   ? Drug use: Yes     Types: Marijuana, Cocaine      Comment: crack/cocaine use daily   ? Sexual activity: Not Asked     Other Topics Concern   ? None     Social History Narrative   ? None       Review of Systems   Constitutional: Negative for fever and chills.   Respiratory: Negative for cough and shortness of breath.    Cardiovascular: Positive for chest pain. Negative for leg swelling.   Gastrointestinal: Negative for nausea, vomiting, abdominal pain and

## 2017-08-17 NOTE — ED Provider Notes
Result Value Ref Range    Sodium 132 (*) 135 - 145 mmol/L    Potassium 3.9  3.3 - 4.6 mmol/L    Comment: SLIGHT HEMOLYSIS  This is an appended report.  These results have been appended to a previously final verified report.    Chloride 90 (*) 101 - 110 mmol/L    CO2 24  21 - 29 mmol/L    Urea Nitrogen 10  6 - 22 mg/dL    Creatinine 0.64 (*) 0.67 - 1.17 mg/dL    BUN/Creatinine Ratio 15.6  6.0 - 22.0 (calc)    Glucose 314 (*) 71 - 99 mg/dL    Calcium 9.3  8.6 - 10.0 mg/dL    Osmolality Calc 275.5      Anion Gap 18 (*) 4 - 16 mmol/L    EGFR >59  mL/min/1.73M2    Comment:   Reference range: =>90 ml/min/1.73M2  eGFR estimates are unable to accurately differentiate levels of GFR above 60 ml/min/1.73M2.   LIPASE - Abnormal     Lipase 122 (*) 0 - 60 U/L   OSMOLALITY BLOOD - Abnormal     Osmolality 333 (*) 274 - 298 mosm/Kg   CBC AUTODIFF - Abnormal     WBC 8.27  4.5 - 11 x10E3/uL    RBC 4.14 (*) 4.50 - 6.30 x10E6/uL    Hemoglobin 12.9 (*) 14.0 - 18.0 g/dL    Hematocrit 36.6 (*) 40.0 - 54.0 %    MCV 88.4  82.0 - 101.0 fl    MCH 31.2  27.0 - 34.0 pg    MCHC 35.2  31.0 - 36.0 g/dL    RDW 13.2  12.0 - 16.1 %    Platelet Count 160  140 - 440 thou/cu mm    MPV 13.4 (*) 9.5 - 11.5 fl    nRBC % 0.0  0.0 - 1.0 %    Absolute NRBC Count 0.00      Neutrophils % 43.7  34.0 - 73.0 %    Lymphocytes % 50.5 (*) 25.0 - 45.0 %    Monocytes % 4.2  2.0 - 6.0 %    Eosinophils % 1.1  1.0 - 4.0 %    Immature Granulocytes % 0.1  0.0 - 2.0 %    Neutrophils Absolute 3.61  1.80 - 8.70 x10E3/uL    Lymphocytes Absolute 4.18  x10E3/uL    Monocytes Absolute 0.35  x10E3/uL    Eosinophils Absolute 0.09  x10E3/uL    Basophil Absolute 0.03  x10E3/uL    Absolute Immature Granulocytes 0.01 (*) 0 - 0 x10E3/uL    Basophils % 0.4  0 - 1 %   ETHYL ALCOHOL - Abnormal     Ethyl Alcohol 116.0 (*) <=10.0 mg/dL    Alcohol g/dL 0.116  g//dL   POCT GLUCOSE - Abnormal     Glucose (Meter) 396 (*) 60.0 - 99.0 mg/dL   POCT GLUCOSE - Abnormal

## 2017-08-17 NOTE — ED Provider Notes
History     Chief Complaint   Patient presents with   ? Chest Pain   ? Abdominal Pain       62 yo M with DM p/w etoh abuse. Triage note states he is here for cp but pt denies this to me. Denies sob, abd pain, n/v. Endorses etoh use and would like food bc he is hungry. Denies falls. Not on blood thinners.             Allergies   Allergen Reactions   ? Pcn [Penicillins] Anaphylaxis       Patient's Medications   New Prescriptions    No medications on file   Previous Medications    GABAPENTIN EX    topically.    INSULIN ASPART (NOVOLOG) VIAL    Inject into the skin 3 times daily (before meals).   Modified Medications    No medications on file   Discontinued Medications    No medications on file       Past Medical History:   Diagnosis Date   ? Diabetes mellitus (CMS-HCC: 19)        History reviewed. No pertinent surgical history.    No family history on file.    Social History     Social History   ? Marital status: Single     Spouse name: N/A   ? Number of children: N/A   ? Years of education: N/A     Social History Main Topics   ? Smoking status: Current Some Day Smoker   ? Smokeless tobacco: Never Used   ? Alcohol use Yes      Comment: occasionally   ? Drug use: Yes     Types: Marijuana, Cocaine      Comment: crack/cocaine use daily   ? Sexual activity: Not Asked     Other Topics Concern   ? None     Social History Narrative   ? None       Review of Systems    Physical Exam     ED Triage Vitals [08/16/17 1914]   BP 130/75   Pulse 100   Resp 18   Temp 36.8 ?C (98.3 ?F)   Temp src Oral   Height 1.651 m   Weight 61.2 kg   SpO2 100 %   BMI (Calculated) 22.51             Physical Exam    Differential DDx: ***    Is this an Emergent Medical Condition? {SH ED EMERGENT MEDICAL CONDITION:901 [id number]}  409.901 FS  641.19 FS  627.732 (16) FS    ED Workup   Procedures    Labs:  -   POCT GLUCOSE - Abnormal        Result Value Ref Range    Glucose (Meter) 396 (*) 60.0 - 99.0 mg/dL   BLOOD GAS VENOUS W/LYTES   BASIC METABOLIC PANEL

## 2017-08-17 NOTE — ED Provider Notes
diarrhea.   Genitourinary: Negative for dysuria, urgency and frequency.   Skin: Negative for rash and wound.   Psychiatric/Behavioral: Positive for substance abuse.   All other systems reviewed and are negative.      Physical Exam     ED Triage Vitals [08/16/17 1914]   BP 130/75   Pulse 100   Resp 18   Temp 36.8 ?C (98.3 ?F)   Temp src Oral   Height 1.651 m   Weight 61.2 kg   SpO2 100 %   BMI (Calculated) 22.51             Physical Exam   Constitutional: He is oriented to person, place, and time. He appears well-developed and well-nourished.   Aox3. Speaking in full sentences. Airway intact   HENT:   Head: Normocephalic and atraumatic.   Right Ear: External ear normal.   Left Ear: External ear normal.   Eyes: Conjunctivae are normal.   Cardiovascular: Normal rate, regular rhythm, normal heart sounds and intact distal pulses.    No murmur heard.  Pulmonary/Chest: Effort normal and breath sounds normal. No respiratory distress. He has no wheezes. He has no rales. He exhibits no tenderness.   Abdominal: Soft. He exhibits no distension and no mass. There is no tenderness. There is no rebound and no guarding.   Abdomen soft, non tender. Pt covered in urine   Musculoskeletal:   No LE edema   Neurological: He is alert and oriented to person, place, and time.   Skin: Skin is warm and dry.   No lesions, ulcers   Nursing note and vitals reviewed.      Differential DDx: PE, Esophageal Rupture, Tension PTX, ACS, Aortic Dissection, Cardiac Tamponade, Angina, Herpes Zoster, Abrasion, Chest Wall Pain, Musculoskeletal Pain, Costochondritis, Rib Fx, Pleurisy, Pleural Effusion, PNA, Bronchitis, URTI, Pericarditis, Myocarditis, Angina, Esophagitis, GERD, PUD, Gastric Ulcer, Pancreatitis, Subphrenic Abscess, Trauma, other.      Is this an Emergent Medical Condition? Yes - Severe Pain/Acute Onset of Symptoms  409.901 FS  641.19 FS  627.732 (16) FS    ED Workup   Procedures    Labs:  -   BASIC METABOLIC PANEL - Abnormal

## 2017-08-17 NOTE — ED Notes
Pt refusing full assessment and to change to gown.

## 2017-08-17 NOTE — ED Provider Notes
Imaging (Read by ED Provider):  {Imaging findings:[id number]}      EKG (Read by ED Provider):  {EKG findings:[phone]}        ED Course & Re-Evaluation          MDM   Decide to obtain history from someone other than the patient: {SH ED Lamonte SakaiJX MDM - OBTAIN ZOXWRUE:45409}HISTORY:28378}    Decide to obtain previous medical records: [facility]{SH ED Lamonte SakaiJX MDM - PREVIOUS MED REC - NO WJX:91478}YES:28380}    Clinical Lab Test(s): {SH ED Lamonte SakaiJX MDM ORDERED AND REVIEWED:28124}    Diagnostic Tests (Radiology, EKG): {SH ED Lamonte SakaiJX MDM ORDERED AND REVIEWED:28124}    Independent Visualization (ED US, Wet Prep, Other): {SH ED Lamonte SakaiJX MDM NO YES GNFAOZHY:86578}WILDCARD:26444}    Discussed patient with NON-ED Provider: {SH ED Lamonte SakaiJX MDM - ANOTHER PROVIDER:28381}      ED Disposition   ED Disposition: No ED Disposition Set      ED Clinical Impression   ED Clinical Impression:   Chest pain, unspecified type      ED Patient Status   Patient Status:   {[facility][id number]}        ED Medical Evaluation Initiated   Medical Evaluation Initiated:  Yes, filed at 08/16/17 1953  by Zola Buttoneopujari, Shivani, MD

## 2017-08-17 NOTE — ED Notes
Pt now verbal with frequent racial slurs. Pt manipulates IV resulting in partial removal and infiltration. IVF stopped and IV removed. MD aware.

## 2017-08-17 NOTE — ED Notes
Pt resting comfortably in bed with eyes closed, NAD, VSS.

## 2017-08-17 NOTE — ED Provider Notes
Clinical Lab Test(s): Ordered and Reviewed    Diagnostic Tests (Radiology, EKG): Ordered and Reviewed    Independent Visualization (ED US, Wet Prep, Other): No    Discussed patient with NON-ED Provider: None      ED Disposition   ED Disposition: No ED Disposition Set      ED Clinical Impression   ED Clinical Impression:   Chest pain, unspecified type  ETOH abuse  Pleurisy      ED Patient Status   Patient Status:   Good        ED Medical Evaluation Initiated   Medical Evaluation Initiated:  Yes, filed at 08/16/17 1953  by Zola Buttoneopujari, Shivani, MD

## 2017-08-17 NOTE — ED Provider Notes
Discussed patient with NON-ED Provider: {SH ED Lamonte SakaiJX MDM - ANOTHER PROVIDER:28381}      ED Disposition   ED Disposition: No ED Disposition Set      ED Clinical Impression   ED Clinical Impression:   Chest pain, unspecified type      ED Patient Status   Patient Status:   {[phone]}        ED Medical Evaluation Initiated   Medical Evaluation Initiated:  Yes, filed at 08/16/17 1953  by Zola Buttoneopujari, Shivani, MD

## 2017-08-17 NOTE — ED Provider Notes
History     Chief Complaint   Patient presents with   ? Chest Pain   ? Abdominal Pain       HPI    Allergies   Allergen Reactions   ? Pcn [Penicillins] Anaphylaxis       Patient's Medications   New Prescriptions    No medications on file   Previous Medications    GABAPENTIN EX    topically.    INSULIN ASPART (NOVOLOG) VIAL    Inject into the skin 3 times daily (before meals).   Modified Medications    No medications on file   Discontinued Medications    No medications on file       Past Medical History:   Diagnosis Date   ? Diabetes mellitus (CMS-HCC: 19)        History reviewed. No pertinent surgical history.    No family history on file.    Social History     Social History   ? Marital status: Single     Spouse name: N/A   ? Number of children: N/A   ? Years of education: N/A     Social History Main Topics   ? Smoking status: Current Some Day Smoker   ? Smokeless tobacco: Never Used   ? Alcohol use Yes      Comment: occasionally   ? Drug use: Yes     Types: Marijuana, Cocaine      Comment: crack/cocaine use daily   ? Sexual activity: Not Asked     Other Topics Concern   ? None     Social History Narrative   ? None       Review of Systems    Physical Exam     ED Triage Vitals [08/16/17 1914]   BP 130/75   Pulse 100   Resp 18   Temp 36.8 ?C (98.3 ?F)   Temp src Oral   Height 1.651 m   Weight 61.2 kg   SpO2 100 %   BMI (Calculated) 22.51             Physical Exam    Differential DDx: ***    Is this an Emergent Medical Condition? {SH ED EMERGENT MEDICAL CONDITION:[id number]}  409.901 FS  641.19 FS  627.732 (16) FS    ED Workup   Procedures    Labs:  -   POCT GLUCOSE - Abnormal        Result Value Ref Range    Glucose (Meter) 396 (*) 60.0 - 99.0 mg/dL   BLOOD GAS VENOUS W/LYTES   BASIC METABOLIC PANEL   HEPATIC FUNCTION PANEL   LIPASE   MAGNESIUM   PHOSPHORUS   BETA HYDROXYBUTYRATE   OSMOLALITY BLOOD   CBC AUTODIFF   POCT GLUCOSE   POCT URINALYSIS AUTO W/O MICROSCOPY   POCT TROPININ I   CBC AND DIFFERENTIAL

## 2017-08-17 NOTE — ED Notes
At time of discharge pt grew agitated, threatened staff with knife while gesturing towards backpack. Backpack immediately confiscated, Security and [facility] called, Security searched backpack and did not find a knife. Pt escorted to exit by [facility] [employer] and [facility].

## 2017-08-17 NOTE — ED Notes
IV access obtained via US. Pt continues not to cooperate with assessment. Pt still requesting crackers.

## 2017-08-17 NOTE — ED Notes
Pt with stumbling gait to bed, adamantly refusing assistance or wheelchair, once there pt refused to get into bed and sat in bedside chair. Very uncooperative with MD, refused to answer questions. Pt asking all staff for crackers despite NPO status being explained. IV/blood draw attempt x2 failed, obtaining US IV.

## 2017-08-17 NOTE — ED Provider Notes
HEPATIC FUNCTION PANEL   LIPASE   MAGNESIUM   PHOSPHORUS   BETA HYDROXYBUTYRATE   OSMOLALITY BLOOD   CBC AUTODIFF   POCT GLUCOSE   POCT URINALYSIS AUTO W/O MICROSCOPY   POCT TROPININ I   CBC AND DIFFERENTIAL         Imaging (Read by ED Provider):  {Imaging findings:[phone]}      EKG (Read by ED Provider):  {EKG findings:[id number]}        ED Course & Re-Evaluation     ED Course as of Aug 16 2128   Thu Aug 16, 2017   2124 Pt clinically intoxicated, etoh level 116. Pulled out his iv. Aox3. Pending labwork, trop added on to labs. Lipase slightly elevated however not clinically significant  [SD]      ED Course User Index  [SD] Zola Buttoneopujari, Shivani, MD         MDM   Decide to obtain history from someone other than the patient: {SH ED Lamonte SakaiJX MDM - OBTAIN ZOXWRUE:45409}HISTORY:28378}    Decide to obtain previous medical records: [facility]{SH ED Lamonte SakaiJX MDM - PREVIOUS MED REC - NO WJX:91478}YES:28380}    Clinical Lab Test(s): {SH ED Lamonte SakaiJX MDM ORDERED AND REVIEWED:28124}    Diagnostic Tests (Radiology, EKG): {SH ED Lamonte SakaiJX MDM ORDERED AND REVIEWED:28124}    Independent Visualization (ED US, Wet Prep, Other): {SH ED Lamonte SakaiJX MDM NO YES GNFAOZHY:86578}WILDCARD:26444}    Discussed patient with NON-ED Provider: {SH ED Lamonte SakaiJX MDM - ANOTHER PROVIDER:28381}      ED Disposition   ED Disposition: No ED Disposition Set      ED Clinical Impression   ED Clinical Impression:   Chest pain, unspecified type      ED Patient Status   Patient Status:   {[facility][id number]}        ED Medical Evaluation Initiated   Medical Evaluation Initiated:  Yes, filed at 08/16/17 1953  by Zola Buttoneopujari, Shivani, MD

## 2017-08-17 NOTE — ED Notes
Difficult to accurately assess pain, pt reports chest pain, points to epigastric region, when asked to elaborate or give more detail he just says he needs crackers.

## 2017-08-17 NOTE — ED Provider Notes
Glucose (Meter) 267 (*) 60.0 - 99.0 mg/dL   POCT URINALYSIS AUTO W/O MICROSCOPY - Abnormal     Color -Ur Yellow      Clarity, UA Clear      Spec Grav 1.015  1.003 - 1.030    pH 5.5  4.5 - 8.0    Urobilinogen -Ur 0.2  <=2.0 E.U./dL    Nitrite -Ur Negative  Negative    Protein-UA 30  (*) Negative mg/dL    Glucose -Ur 161500  (*) Negative mg/dL    Blood, UA Trace (*) Negative    WBC, UA Negative  Negative    Bilirubin -Ur Negative  Negative    Ketones -UR Negative  Negative   MAGNESIUM - Normal    Magnesium 2.0  1.8 - 2.6 mg/dL   PHOSPHORUS - Normal    Phosphorus,Inorganic 4.2  2.5 - 4.5 mg/dL   BETA HYDROXYBUTYRATE - Normal    B-Hydroxybutyrate 2.0  0.2 - 2.8 mg/dL   TROPONIN T (ED -ONLY) - Normal    Troponin T <0.01  0.00 - 0.04 ng/ml   HEPATIC FUNCTION PANEL    Albumin 4.4  3.8 - 4.9 g/dL    Total Bilirubin 0.8  0.2 - 1.0 mg/dL    Bilirubin, Direct 0.1  0.0 - 0.2 mg/dL    Bilirubin, Indirect 0.7  8mg /dL mg/dL    Alkaline Phosphatase 102  40 - 129 IU/L    AST 31  14 - 33 IU/L    ALT 28  10 - 42 IU/L    Total Protein 8.0  6.5 - 8.3 g/dL    ALBUMIN/GLOBULIN RATIO 1.2  (calc)    Calc Total Globuin 3.6  g/dL   BLOOD GAS VENOUS W/LYTES   POCT GLUCOSE   POCT URINALYSIS AUTO W/O MICROSCOPY   POCT TROPININ I   CBC AND DIFFERENTIAL         Imaging (Read by ED Provider):  Per Radiology:  Xr Chest Single View    Result Date: 08/16/2017  No acute cardiopulmonary process.         EKG (Read by ED Provider):  Unchanged, no stemi        ED Course & Re-Evaluation     ED Course as of Aug 16 2320   Thu Aug 16, 2017   2124 Pt clinically intoxicated, etoh level 116. Pulled out his iv. Aox3. Pending labwork, trop added on to labs. Lipase slightly elevated however not clinically significant  [SD]   2137 Osmolal gap 16  [SD]   2222 Trop negative. Heart score 3. Cxr wnl. Ekg unchanged  [SD]   2245 Pt resting comfortably. Labs wnl. Benign exam. Hr 80s. Ate a sandwich. Will dc with instructions to have repeat ua in 1 month for trace

## 2017-08-17 NOTE — ED Notes
Pt continues to ask everyone passing for crackers, now asking other patients for crackers, received crackers from another pt, MD aware. Pt continues not to cooperate with assessment.

## 2017-08-17 NOTE — ED Notes
Time of discharge: 2306  PM., Patient discharged to  Home.  Patient discharged  ambulatory. to exit with belongings in  Stable condition.  Patient escorted by  no one., Written discharge instructions given to  patient.  Patient/recipient refuses acknowledgement of discharge instructions, instructions placed in pt's backpack. Pt agitated, VSS, FLACC 0 prior to waking, pt remains uncooperative while awake. After security search all belongings returned to pt. Pt escorted to exit.

## 2017-08-17 NOTE — ED Notes
Pt continues to ask everyone passing for crackers, now asking other pt's for crackers, received crackers from another pt, MD aware. Pt continues not to cooperate with assessment.

## 2017-08-17 NOTE — ED Notes
No IV access required at this time per MD, pt too uncooperative. Previous Trop failed, Trop T added on to blood in lab by MD.

## 2017-08-17 NOTE — ED Notes
No IV access required at this time, pt too uncooperative. Previous Trop failed, Trop T added on to blood in lab by MD.

## 2017-08-17 NOTE — ED Provider Notes
Gastrointestinal: Negative for nausea, vomiting, abdominal pain and diarrhea.   Genitourinary: Negative for dysuria, urgency and frequency.   Skin: Negative for rash and wound.   Psychiatric/Behavioral: Positive for substance abuse.   All other systems reviewed and are negative.      Physical Exam     ED Triage Vitals [08/16/17 1914]   BP 130/75   Pulse 100   Resp 18   Temp 36.8 ?C (98.3 ?F)   Temp src Oral   Height 1.651 m   Weight 61.2 kg   SpO2 100 %   BMI (Calculated) 22.51             Physical Exam   Constitutional: He is oriented to person, place, and time. He appears well-developed and well-nourished.   Aox3. Speaking in full sentences. Airway intact   HENT:   Head: Normocephalic and atraumatic.   Right Ear: External ear normal.   Left Ear: External ear normal.   Eyes: Conjunctivae are normal.   Cardiovascular: Normal rate, regular rhythm, normal heart sounds and intact distal pulses.    No murmur heard.  Pulmonary/Chest: Effort normal and breath sounds normal. No respiratory distress. He has no wheezes. He has no rales. He exhibits no tenderness.   Abdominal: Soft. He exhibits no distension and no mass. There is no tenderness. There is no rebound and no guarding.   Abdomen soft, non tender. Pt covered in urine   Musculoskeletal:   No LE edema   Neurological: He is alert and oriented to person, place, and time.   Skin: Skin is warm and dry.   No lesions, ulcers   Nursing note and vitals reviewed.      Differential DDx: PE, Esophageal Rupture, Tension PTX, ACS, Aortic Dissection, Cardiac Tamponade, Angina, Herpes Zoster, Abrasion, Chest Wall Pain, Musculoskeletal Pain, Costochondritis, Rib Fx, Pleurisy, Pleural Effusion, PNA, Bronchitis, URTI, Pericarditis, Myocarditis, Angina, Esophagitis, GERD, PUD, Gastric Ulcer, Pancreatitis, Subphrenic Abscess, Trauma, other.      Is this an Emergent Medical Condition? Yes - Severe Pain/Acute Onset of Symptoms  409.901 FS  641.19 FS  627.732 (16) FS    ED Workup

## 2017-08-17 NOTE — ED Provider Notes
Cardiovascular: Normal rate, regular rhythm and normal heart sounds.    Pulmonary/Chest: Effort normal and breath sounds normal. No respiratory distress.   Abdominal: Soft. He exhibits no distension and no mass. There is no tenderness. There is no rebound and no guarding.   Musculoskeletal:   No LE edema   Neurological: He is alert and oriented to person, place, and time.   Skin: Skin is warm and dry.   No lesions, ulcers   Nursing note and vitals reviewed.      Differential DDx: ***    Is this an Emergent Medical Condition? {SH ED EMERGENT MEDICAL CONDITION:[id number]}  409.901 FS  641.19 FS  627.732 (16) FS    ED Workup   Procedures    Labs:  -   POCT GLUCOSE - Abnormal        Result Value Ref Range    Glucose (Meter) 396 (*) 60.0 - 99.0 mg/dL   BLOOD GAS VENOUS W/LYTES   BASIC METABOLIC PANEL   HEPATIC FUNCTION PANEL   LIPASE   MAGNESIUM   PHOSPHORUS   BETA HYDROXYBUTYRATE   OSMOLALITY BLOOD   CBC AUTODIFF   POCT GLUCOSE   POCT URINALYSIS AUTO W/O MICROSCOPY   POCT TROPININ I   CBC AND DIFFERENTIAL         Imaging (Read by ED Provider):  {Imaging [phone]}      EKG (Read by ED Provider):  {EKG [phone]}        ED Course & Re-Evaluation     ED Course as of Aug 17 2139   Thu Aug 16, 2017   2124 Pt clinically intoxicated, etoh level 116. Pulled out his iv. Aox3. Pending labwork, trop added on to labs. Lipase slightly elevated however not clinically significant  [SD]   2137 Osmolal gap 16  [SD]      ED Course User Index  [SD] Zola Buttoneopujari, Shivani, MD         MDM   Decide to obtain history from someone other than the patient: {SH ED Lamonte SakaiJX MDM - OBTAIN [id number]}HISTORY:28378}    Decide to obtain previous medical records: [facility]{SH ED Lamonte SakaiJX MDM - PREVIOUS MED REC - NO NFA:21308}YES:28380}    Clinical Lab Test(s): {SH ED Lamonte SakaiJX MDM ORDERED AND REVIEWED:28124}    Diagnostic Tests (Radiology, EKG): {SH ED Lamonte SakaiJX MDM ORDERED AND REVIEWED:28124}    Independent Visualization (ED US, Wet Prep, Other): [facility] NO YES [id number]:26444}

## 2017-08-17 NOTE — ED Provider Notes
Procedures    Labs:  -   BASIC METABOLIC PANEL - Abnormal        Result Value Ref Range    Sodium 132 (*) 135 - 145 mmol/L    Potassium 3.9  3.3 - 4.6 mmol/L    Comment: SLIGHT HEMOLYSIS  This is an appended report.  These results have been appended to a previously final verified report.    Chloride 90 (*) 101 - 110 mmol/L    CO2 24  21 - 29 mmol/L    Urea Nitrogen 10  6 - 22 mg/dL    Creatinine 0.64 (*) 0.67 - 1.17 mg/dL    BUN/Creatinine Ratio 15.6  6.0 - 22.0 (calc)    Glucose 314 (*) 71 - 99 mg/dL    Calcium 9.3  8.6 - 10.0 mg/dL    Osmolality Calc 275.5      Anion Gap 18 (*) 4 - 16 mmol/L    EGFR >59  mL/min/1.73M2    Comment:   Reference range: =>90 ml/min/1.73M2  eGFR estimates are unable to accurately differentiate levels of GFR above 60 ml/min/1.73M2.   LIPASE - Abnormal     Lipase 122 (*) 0 - 60 U/L   OSMOLALITY BLOOD - Abnormal     Osmolality 333 (*) 274 - 298 mosm/Kg   CBC AUTODIFF - Abnormal     WBC 8.27  4.5 - 11 x10E3/uL    RBC 4.14 (*) 4.50 - 6.30 x10E6/uL    Hemoglobin 12.9 (*) 14.0 - 18.0 g/dL    Hematocrit 36.6 (*) 40.0 - 54.0 %    MCV 88.4  82.0 - 101.0 fl    MCH 31.2  27.0 - 34.0 pg    MCHC 35.2  31.0 - 36.0 g/dL    RDW 13.2  12.0 - 16.1 %    Platelet Count 160  140 - 440 thou/cu mm    MPV 13.4 (*) 9.5 - 11.5 fl    nRBC % 0.0  0.0 - 1.0 %    Absolute NRBC Count 0.00      Neutrophils % 43.7  34.0 - 73.0 %    Lymphocytes % 50.5 (*) 25.0 - 45.0 %    Monocytes % 4.2  2.0 - 6.0 %    Eosinophils % 1.1  1.0 - 4.0 %    Immature Granulocytes % 0.1  0.0 - 2.0 %    Neutrophils Absolute 3.61  1.80 - 8.70 x10E3/uL    Lymphocytes Absolute 4.18  x10E3/uL    Monocytes Absolute 0.35  x10E3/uL    Eosinophils Absolute 0.09  x10E3/uL    Basophil Absolute 0.03  x10E3/uL    Absolute Immature Granulocytes 0.01 (*) 0 - 0 x10E3/uL    Basophils % 0.4  0 - 1 %   ETHYL ALCOHOL - Abnormal     Ethyl Alcohol 116.0 (*) <=10.0 mg/dL    Alcohol g/dL 0.116  g//dL   POCT GLUCOSE - Abnormal

## 2017-08-17 NOTE — ED Provider Notes
blood  [SD]      ED Course User Index  [SD] Zola Buttoneopujari, Shivani, MD         MDM   Decide to obtain history from someone other than the patient: No    Decide to obtain previous medical records: No    Clinical Lab Test(s): Ordered and Reviewed    Diagnostic Tests (Radiology, EKG): Ordered and Reviewed    Independent Visualization (ED US, Wet Prep, Other): No    Discussed patient with NON-ED Provider: None      ED Disposition   ED Disposition: Discharge      ED Clinical Impression   ED Clinical Impression:   Chest pain, unspecified type  ETOH abuse  Pleurisy      ED Patient Status   Patient Status:   Good        ED Medical Evaluation Initiated   Medical Evaluation Initiated:  Yes, filed at 08/16/17 1953  by Zola Buttoneopujari, Shivani, MD            Zola Buttoneopujari, Shivani, MD  Resident  08/16/17 [phone]

## 2018-04-29 IMAGING — CT CT HEAD W/O CM
3 of 6 series · 16 of 47 positions shown, 19 images · non-contrast
Comparison: 10/18/2016

CLINICAL DATA: Altered mental status

EXAM:
CT HEAD WITHOUT CONTRAST
TECHNIQUE: Contiguous axial images were obtained from the base of the skull
through the vertex without intravenous contrast.

[Series 2: head wo · axial · 0.41mm/px · z∈[-73,+52]mm · 11 of 31 slices shown, 14 images]
[im 3/31  brain]
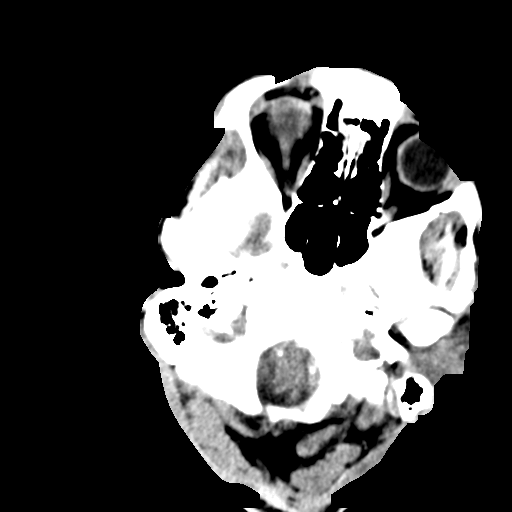
[im 3/31  bone]
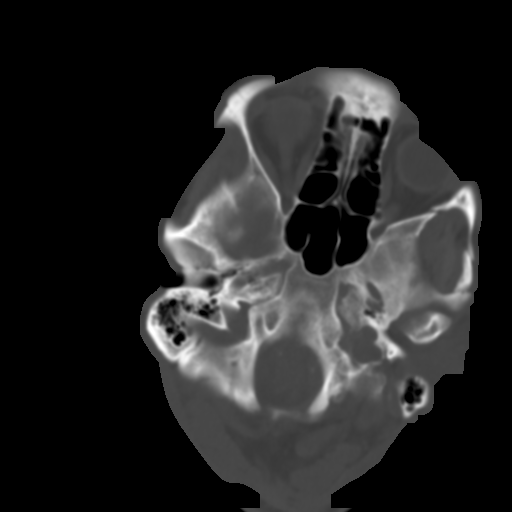
[im 5/31  brain]
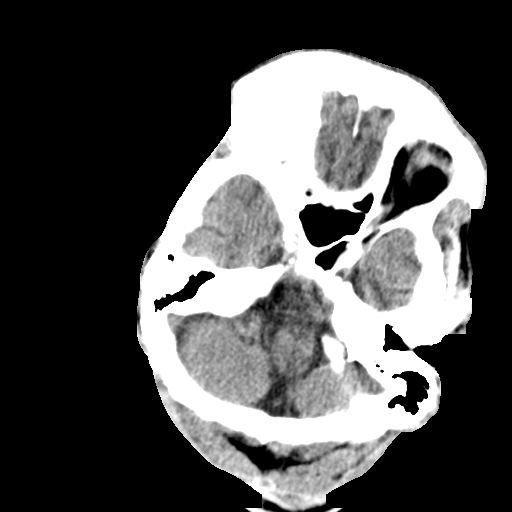
[im 7/31  brain]
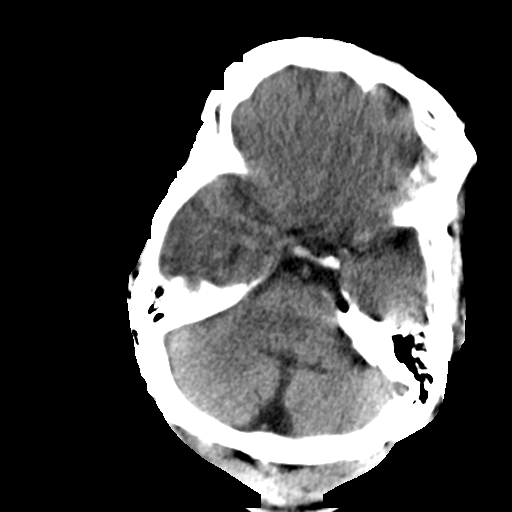
[im 11/31  brain]
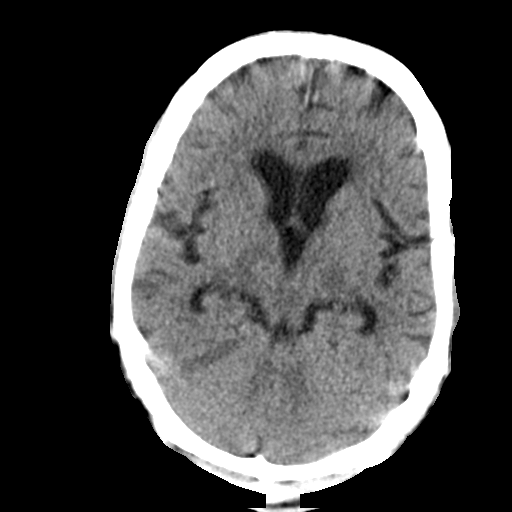
[im 13/31  brain]
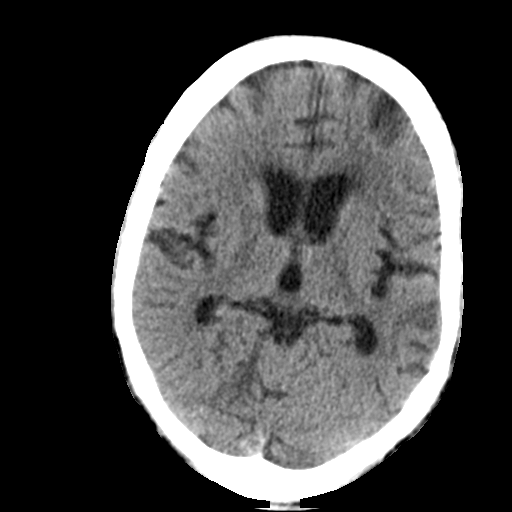
[im 13/31  bone]
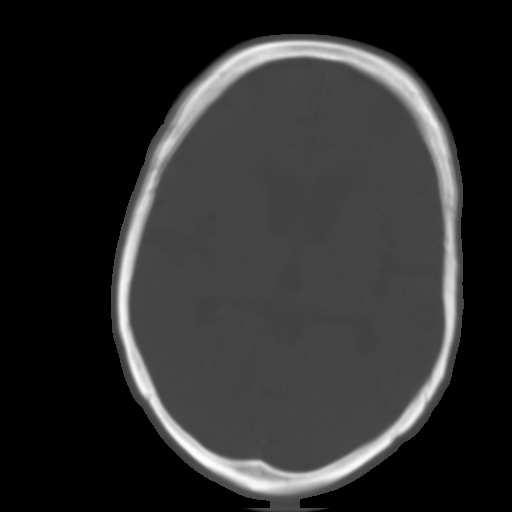
[im 16/31  brain]
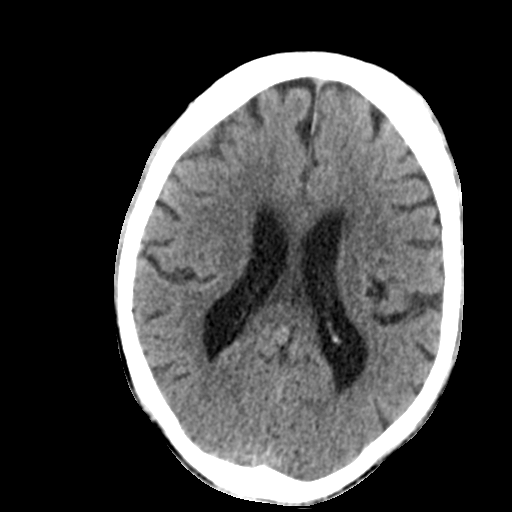
[im 18/31  brain]
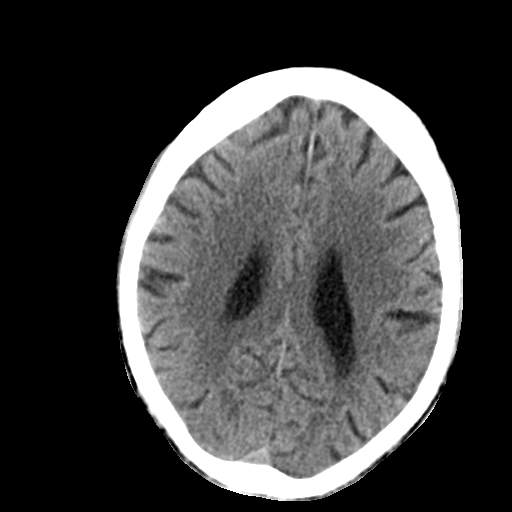
[im 20/31  brain]
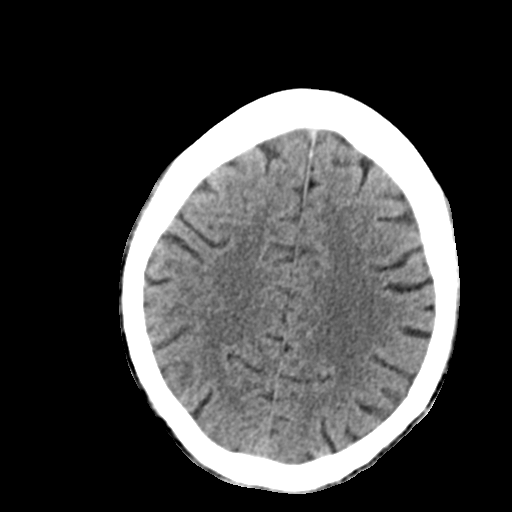
[im 24/31  brain]
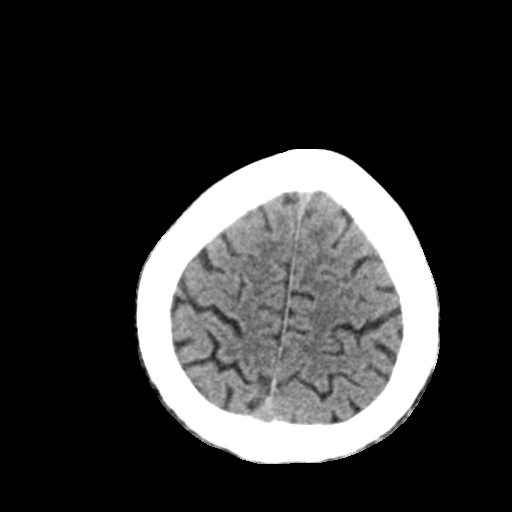
[im 24/31  bone]
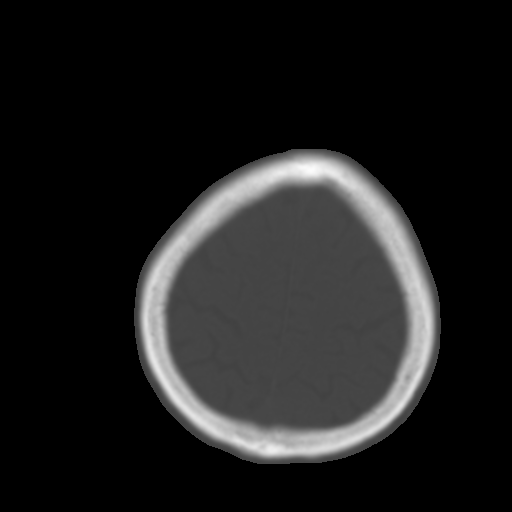
[im 26/31  brain]
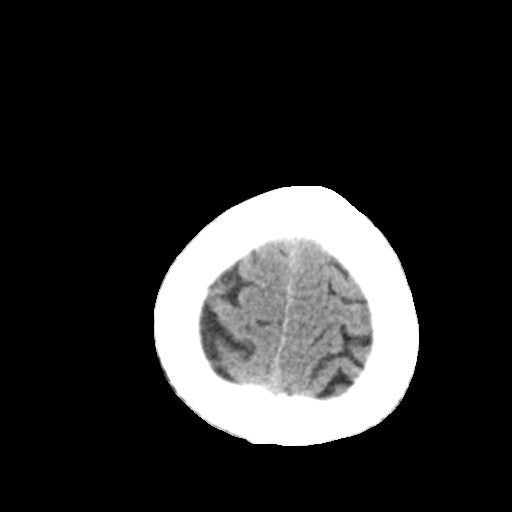
[im 28/31  brain]
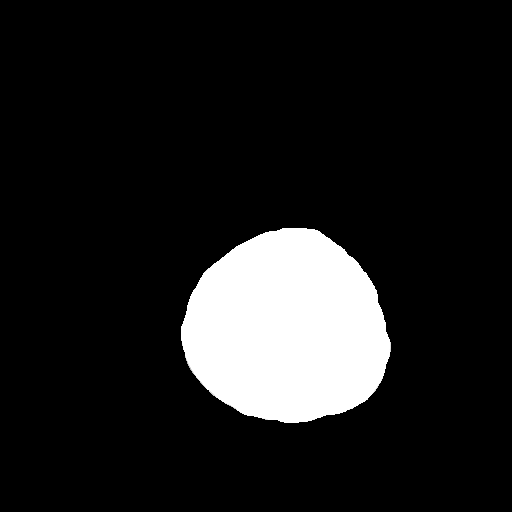

[Series 4: coronal soft tissue · coronal · 0.30mm/px · 3 of 67 slices shown]
[im 17/67  brain]
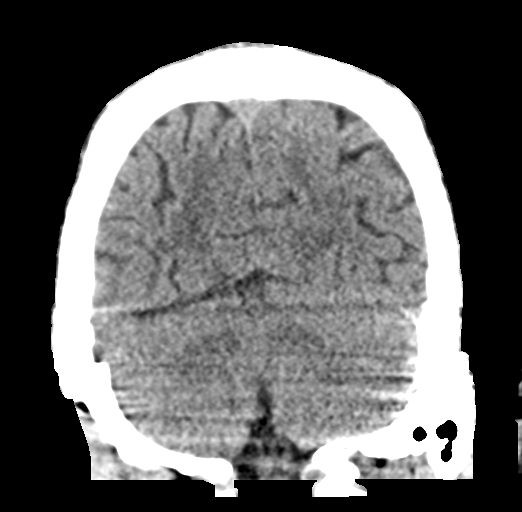
[im 34/67  brain]
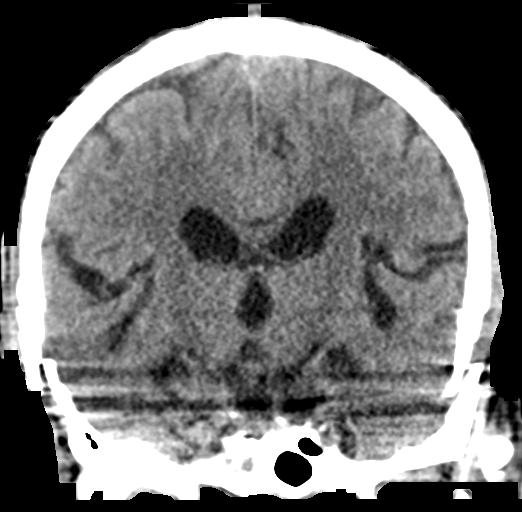
[im 50/67  brain]
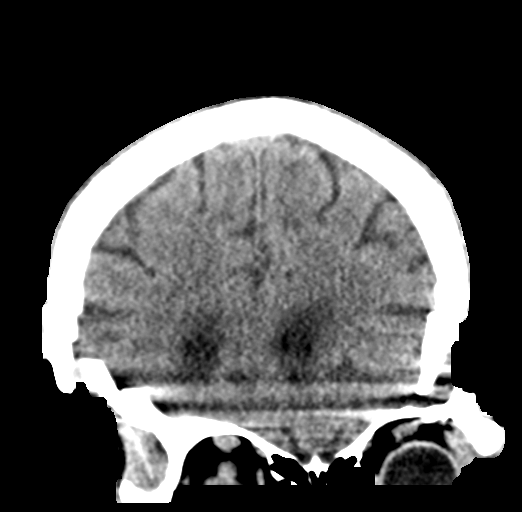

[Series 9: sagittal soft tissue · sagittal · 0.31mm/px · 2 of 54 slices shown]
[im 18/54  brain]
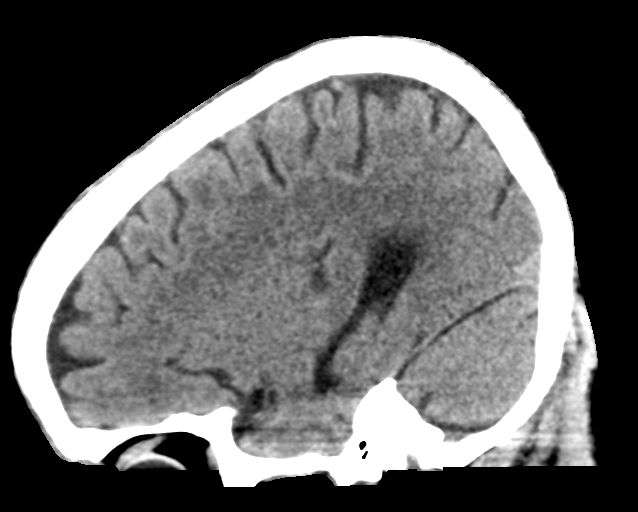
[im 36/54  brain]
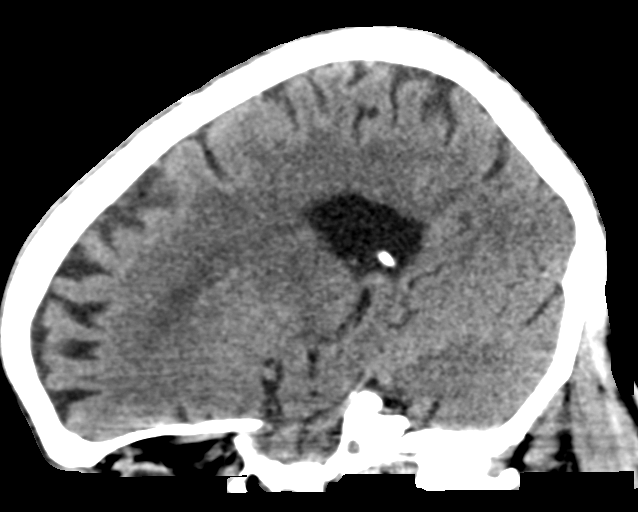

[16 of 47 positions shown; findings below may reference images not displayed]

FINDINGS: Brain: No evidence of acute infarction, hemorrhage, hydrocephalus,
extra-axial collection or mass lesion/mass effect.

Ventricles and sulci are enlarged reflecting mild atrophy, greater
than generally seen in this patient's age. Mild periventricular
white matter hypoattenuation is noted consistent with chronic
microvascular ischemic change.

Vascular: No hyperdense vessel or unexpected calcification.

Skull: Normal. Negative for fracture or focal lesion.

Sinuses/Orbits: Visualize globes and orbits are unremarkable.
Visualized sinuses and mastoid air cells are clear.

Other: None.
IMPRESSION: 1. No acute intracranial abnormalities.
2. Mild atrophy and chronic microvascular ischemic change.

## 2023-03-10 ENCOUNTER — Inpatient Hospital Stay: Admit: 2023-03-10 | Discharge: 2023-03-11 | Payer: Medicare Other

## 2023-03-10 DIAGNOSIS — E119 Type 2 diabetes mellitus without complications: Principal | ICD-10-CM

## 2023-03-10 DIAGNOSIS — E162 Hypoglycemia, unspecified: Secondary | ICD-10-CM

## 2023-03-10 DIAGNOSIS — I10 Essential (primary) hypertension: Principal | ICD-10-CM

## 2023-03-10 MED ORDER — CARVEDILOL 3.125 MG PO TABS
3.125 mg | Freq: Once | ORAL | Status: CP
Start: 2023-03-10 — End: ?
